# Patient Record
Sex: Female | Born: 2000 | Race: Black or African American | Hispanic: No | Marital: Married | Smoking: Never smoker
Health system: Southern US, Community
[De-identification: ages and names within clinical notes are randomized; demographics above are authoritative.]

## PROBLEM LIST (undated history)

## (undated) ENCOUNTER — Inpatient Hospital Stay: Payer: Self-pay

## (undated) ENCOUNTER — Inpatient Hospital Stay (HOSPITAL_COMMUNITY): Payer: Self-pay

## (undated) DIAGNOSIS — F32A Depression, unspecified: Secondary | ICD-10-CM

## (undated) DIAGNOSIS — F419 Anxiety disorder, unspecified: Secondary | ICD-10-CM

## (undated) DIAGNOSIS — F329 Major depressive disorder, single episode, unspecified: Secondary | ICD-10-CM

## (undated) HISTORY — PX: MULTIPLE TOOTH EXTRACTIONS: SHX2053

## (undated) HISTORY — PX: WISDOM TOOTH EXTRACTION: SHX21

---
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
## 2022-06-25 ENCOUNTER — Inpatient Hospital Stay (HOSPITAL_COMMUNITY)
Admission: AD | Admit: 2022-06-25 | Discharge: 2022-06-25 | Disposition: A | Discharge status: Home / Self Care | Payer: Medicaid Other | Attending: Obstetrics & Gynecology | Admitting: Obstetrics & Gynecology

## 2022-06-25 ENCOUNTER — Encounter (HOSPITAL_COMMUNITY): Payer: Self-pay | Admitting: *Deleted

## 2022-06-25 DIAGNOSIS — R1030 Lower abdominal pain, unspecified: Secondary | ICD-10-CM | POA: Insufficient documentation

## 2022-06-25 DIAGNOSIS — Z3A10 10 weeks gestation of pregnancy: Secondary | ICD-10-CM | POA: Diagnosis not present

## 2022-06-25 DIAGNOSIS — O26891 Other specified pregnancy related conditions, first trimester: Secondary | ICD-10-CM | POA: Diagnosis present

## 2022-06-25 DIAGNOSIS — R0602 Shortness of breath: Secondary | ICD-10-CM | POA: Diagnosis not present

## 2022-06-25 DIAGNOSIS — R079 Chest pain, unspecified: Secondary | ICD-10-CM | POA: Insufficient documentation

## 2022-06-25 DIAGNOSIS — O26899 Other specified pregnancy related conditions, unspecified trimester: Secondary | ICD-10-CM

## 2022-06-25 LAB — URINALYSIS, ROUTINE W REFLEX MICROSCOPIC
Bilirubin Urine: NEGATIVE
Glucose, UA: NEGATIVE mg/dL
Hgb urine dipstick: NEGATIVE
Ketones, ur: NEGATIVE mg/dL
Nitrite: NEGATIVE
Protein, ur: NEGATIVE mg/dL
Specific Gravity, Urine: 1.026 (ref 1.005–1.030)
pH: 5 (ref 5.0–8.0)

## 2022-06-25 NOTE — MAU Note (Signed)
Kara Nolan is a 21 y.o. at Unknown here in MAU reporting: having cramping, feels like her heart is fluttering.  Been feeling light headed. Pt is preg. Has been seen at her regular dr in [place]. Has had [place] that showed viable IUP. No bleeding.  Denies GU or GI complaints Onset of complaint: started yesterday Pain score: mild cramping There were no vitals filed for this visit.   FHT:167 Lab orders placed from triage:  urine

## 2022-06-25 NOTE — MAU Provider Note (Addendum)
History     CSN: [id number]  Arrival date and time: 06/25/22 1557   Event Date/Time   First Provider Initiated Contact with Patient 06/25/22 1650      No chief complaint on file.  HPI Kara Nolan is a 21 y.o. G1P0 at [redacted]w[redacted]d who receives care at [facility]. She has an appt on Jan 11th. She presents today for Cramping and SOB.  She reports the cramping is located on her right side and is intermittent.  She states the pain has no worsening or improving factors and is mild and not currently present. She endorses having an [place] with this pregnancy. She states the pain duration is unknown to her. 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  She states it lasted up until arrival. She states it was difficult for her to catch her breath.  She endorses asthma prior to pregnancy. She reports drinking "enough water" throughout the day.  Patient denies unknown history of asthma.   OB History     Gravida  1   Para      Term      Preterm      AB      Living         SAB      IAB      Ectopic      Multiple      Live Births              Past Medical History:  Diagnosis Date   Depression     Past Surgical History:  Procedure Laterality Date   MULTIPLE TOOTH EXTRACTIONS      Family History  Problem Relation Age of Onset   Hypertension Paternal Grandmother    Heart disease Paternal Grandmother        mitral valve prolapse   Cancer Neg Hx    Stroke Neg Hx    Depression Neg Hx     Social History   Tobacco Use   Smoking status: Never  Substance Use Topics   Alcohol use: No    Comment: Pt denied   Drug use: No    Comment: Pt denied    Allergies: No Known Allergies  Medications Prior to Admission  Medication Sig Dispense Refill Last Dose   cefdinir (OMNICEF) 300 MG capsule Take 2 capsules (600 mg total) by mouth daily. 20 capsule 0    fluticasone (FLONASE) 50 MCG/ACT nasal spray Place 2 sprays into both nostrils  daily. 16 g 0    ibuprofen (ADVIL,MOTRIN) 100 MG/5ML suspension Take 5 mg/kg by mouth every 6 (six) hours as needed.      ibuprofen (ADVIL,MOTRIN) 400 MG tablet Take 1 tablet (400 mg total) by mouth every 6 (six) hours as needed. 30 tablet 0    ondansetron (ZOFRAN) 4 MG tablet Take 1 tablet (4 mg total) by mouth every 8 (eight) hours as needed for nausea or vomiting. 4 tablet 0     Review of Systems  Eyes:  Negative for visual disturbance.  Gastrointestinal:  Positive for abdominal pain (Right side). Negative for constipation, diarrhea, nausea and vomiting.  Genitourinary:  Negative for difficulty urinating, dysuria, vaginal bleeding and vaginal discharge.  Neurological:  Positive for dizziness (None currently, but has occurred prior to arrival.). Negative for light-headedness and headaches.   Physical Exam   Blood pressure (!) 102/55, pulse 79, temperature 99.1 F (37.3 C), temperature source Oral, resp. rate 16, height 5\' 4"  (1.626 m), weight 44.4  kg, SpO2 100 %.  Physical Exam Constitutional:      Appearance: She is well-developed.  HENT:     Head: Normocephalic and atraumatic.  Eyes:     Conjunctiva/sclera: Conjunctivae normal.  Pulmonary:     Effort: Pulmonary effort is normal. No respiratory distress.  Abdominal:     General: Bowel sounds are normal.  Neurological:     Mental Status: She is alert and oriented to person, place, and time.  Psychiatric:        Mood and Affect: Mood normal.        Behavior: Behavior normal.     MAU Course  Procedures Results for orders placed or performed during the hospital encounter of 06/25/22 (from the past 24 hour(s))  Urinalysis, Routine w reflex microscopic Urine, Clean Catch     Status: Abnormal   Collection Time: 06/25/22  5:11 PM  Result Value Ref Range   Color, Urine YELLOW YELLOW   APPearance HAZY (A) CLEAR   Specific Gravity, Urine 1.026 1.005 - 1.030   pH 5.0 5.0 - 8.0   Glucose, UA NEGATIVE NEGATIVE mg/dL   Hgb urine  dipstick NEGATIVE NEGATIVE   Bilirubin Urine NEGATIVE NEGATIVE   Ketones, ur NEGATIVE NEGATIVE mg/dL   Protein, ur NEGATIVE NEGATIVE mg/dL   Nitrite NEGATIVE NEGATIVE   Leukocytes,Ua SMALL (A) NEGATIVE     Patient informed that the ultrasound is considered a limited OB ultrasound and is not intended to be a complete ultrasound exam.  Patient also informed that the ultrasound is not being completed with the intent of assessing for fetal or placental anomalies or any pelvic abnormalities.  Explained that the purpose of today's ultrasound is to assess for  viability.  Patient acknowledges the purpose of the exam and the limitations of the study.  SIUP at 10 wks by CRL. FHR 181.  Yolk Sac noted.         MDM EKG BSUS Assessment and Plan  21 year old G1P0 at 10 weeks Abdominal Cramping SOB  -EKG performed and normal.  -BSUS performed as above. -Discussed collection of vaginal swabs to r/o infection/disease, patient declines stating she has appt tomorrow. -Will send urine for assessment and culture if needed. -Discussed normal physiological changes that occur in pregnancy that can account for feelings of occasional heart fluttering and SOB. Reassured that normal as long as not extended periods or increased frequency. -Further informed that abdominal cramping is likely RLP.  Reviewed this anticipated pregnancy condition/complaint.   -Patient verbalizes understanding and without questions. -Informed that BSUS without concerns. -Precautions reviewed. -Encouraged to call primary office or return to MAU if symptoms worsen or with the onset of new symptoms. -Discharged to home in stable condition.  Maryann Conners 06/25/2022, 4:50 PM   Addendum 6:58 PM) UA without significant findings. No culture ordered.  Maryann Conners MSN, CNM Advanced Practice Provider, [facility]

## 2022-07-17 DIAGNOSIS — R636 Underweight: Secondary | ICD-10-CM | POA: Diagnosis present

## 2022-07-26 ENCOUNTER — Inpatient Hospital Stay (HOSPITAL_COMMUNITY)
Admission: AD | Admit: 2022-07-26 | Discharge: 2022-07-26 | Disposition: A | Discharge status: Home / Self Care | Payer: Medicaid Other | Attending: Family Medicine | Admitting: Family Medicine

## 2022-07-26 ENCOUNTER — Other Ambulatory Visit: Payer: Self-pay

## 2022-07-26 DIAGNOSIS — R1030 Lower abdominal pain, unspecified: Secondary | ICD-10-CM | POA: Diagnosis present

## 2022-07-26 DIAGNOSIS — Z3A14 14 weeks gestation of pregnancy: Secondary | ICD-10-CM | POA: Insufficient documentation

## 2022-07-26 DIAGNOSIS — O26892 Other specified pregnancy related conditions, second trimester: Secondary | ICD-10-CM | POA: Insufficient documentation

## 2022-07-26 DIAGNOSIS — Z5321 Procedure and treatment not carried out due to patient leaving prior to being seen by health care provider: Secondary | ICD-10-CM | POA: Diagnosis not present

## 2022-07-26 LAB — WET PREP, GENITAL
Clue Cells Wet Prep HPF POC: NONE SEEN
Sperm: NONE SEEN
Trich, Wet Prep: NONE SEEN
WBC, Wet Prep HPF POC: >=10 — AB (ref <10)
Yeast Wet Prep HPF POC: NONE SEEN

## 2022-07-26 NOTE — MAU Note (Signed)
Pt left ama, form signed

## 2022-07-26 NOTE — MAU Note (Signed)
.  [name] is a 22 y.o. at [redacted]w[redacted]d here in MAU reporting: lower abd cramping that started yesterday.  Pt reports pain has been on and off since then.  Reports pain is not "as bad as it was last night".  Denies vag bleeding.  Reports she was diagnosed with UTI last week and just started her antibiotics yesterday.    Onset of complaint: 1/20 Pain score: 4 Vitals:   07/26/22 1030  BP: 109/62  Pulse: (!) 106  Resp: 15  Temp: 99 F (37.2 C)  SpO2: 100%     FHT:150 Lab orders placed from triage:   u/a

## 2022-07-27 LAB — GC/CHLAMYDIA PROBE AMP (~~LOC~~) NOT AT ARMC
Chlamydia: NEGATIVE
Comment: NEGATIVE
Comment: NORMAL
Neisseria Gonorrhea: NEGATIVE

## 2022-09-03 ENCOUNTER — Encounter: Payer: Self-pay | Admitting: *Deleted

## 2022-09-07 ENCOUNTER — Encounter (HOSPITAL_COMMUNITY): Payer: Self-pay | Admitting: Family Medicine

## 2022-09-07 ENCOUNTER — Encounter: Payer: Self-pay | Admitting: Obstetrics and Gynecology

## 2022-09-07 ENCOUNTER — Inpatient Hospital Stay (HOSPITAL_COMMUNITY)
Admission: AD | Admit: 2022-09-07 | Discharge: 2022-09-07 | Disposition: A | Discharge status: Home / Self Care | Payer: Medicaid Other | Attending: Family Medicine | Admitting: Family Medicine

## 2022-09-07 DIAGNOSIS — R109 Unspecified abdominal pain: Secondary | ICD-10-CM

## 2022-09-07 DIAGNOSIS — Z3A21 21 weeks gestation of pregnancy: Secondary | ICD-10-CM | POA: Insufficient documentation

## 2022-09-07 DIAGNOSIS — O26892 Other specified pregnancy related conditions, second trimester: Secondary | ICD-10-CM | POA: Diagnosis not present

## 2022-09-07 DIAGNOSIS — Z0371 Encounter for suspected problem with amniotic cavity and membrane ruled out: Secondary | ICD-10-CM

## 2022-09-07 DIAGNOSIS — O26899 Other specified pregnancy related conditions, unspecified trimester: Secondary | ICD-10-CM

## 2022-09-07 DIAGNOSIS — O368120 Decreased fetal movements, second trimester, not applicable or unspecified: Secondary | ICD-10-CM | POA: Diagnosis present

## 2022-09-07 DIAGNOSIS — Z3490 Encounter for supervision of normal pregnancy, unspecified, unspecified trimester: Secondary | ICD-10-CM | POA: Insufficient documentation

## 2022-09-07 DIAGNOSIS — R1030 Lower abdominal pain, unspecified: Secondary | ICD-10-CM | POA: Insufficient documentation

## 2022-09-07 HISTORY — DX: Anxiety disorder, unspecified: F41.9

## 2022-09-07 LAB — URINALYSIS, ROUTINE W REFLEX MICROSCOPIC
Bilirubin Urine: NEGATIVE
Glucose, UA: NEGATIVE mg/dL
Hgb urine dipstick: NEGATIVE
Ketones, ur: NEGATIVE mg/dL
Nitrite: NEGATIVE
Protein, ur: NEGATIVE mg/dL
Specific Gravity, Urine: 1.018 (ref 1.005–1.030)
Squamous Epithelial / HPF: >50 /HPF (ref 0–5)
pH: 6 (ref 5.0–8.0)

## 2022-09-07 LAB — WET PREP, GENITAL
Clue Cells Wet Prep HPF POC: NONE SEEN
Sperm: NONE SEEN
Trich, Wet Prep: NONE SEEN
WBC, Wet Prep HPF POC: <10 (ref <10)
Yeast Wet Prep HPF POC: NONE SEEN

## 2022-09-07 LAB — AMNISURE RUPTURE OF MEMBRANE (ROM) NOT AT ARMC: Amnisure ROM: NEGATIVE

## 2022-09-07 NOTE — MAU Note (Addendum)
.  [name] is a 22 y.o. at 43w1dhere in MAU reporting: has not really felt baby move for a few days. . Stated she has felt like she has had some leaking (wet underwear) for a few days as well and c/o some cramping since Friday  LMP:  Onset of complaint: Friday Pain score: 7 Vitals:   09/07/22 1613  BP: 109/60  Pulse: 75  Resp: 18  Temp: 98.8 F (37.1 C)     FHT:146 Lab orders placed from triage:  u/a

## 2022-09-07 NOTE — Discharge Instructions (Signed)

## 2022-09-07 NOTE — MAU Provider Note (Signed)
History     CSN: [id number]  Arrival date and time: 09/07/22 1523   Event Date/Time   First Provider Initiated Contact with Patient 09/07/22 1629      Chief Complaint  Patient presents with   Decreased Fetal Movement   Vaginal Discharge   HPI  Kara Nolan is a 22 y.o. G1P0 at 57w1dwho presents for evaluation of decreased fetal movement. Patient reports she has felt less movement over the last 2 days. She also reports feeling like her underwear are wet. She reports it is clear discharge. She reports lower abdominal cramping since the leaking started. Patient rates the pain as a 3/10 and has not tried anything for the pain. She denies any vaginal bleeding. Denies any constipation, diarrhea or any urinary complaints.   OB History     Gravida  1   Para      Term      Preterm      AB      Living         SAB      IAB      Ectopic      Multiple      Live Births              Past Medical History:  Diagnosis Date   Anxiety    Depression     Past Surgical History:  Procedure Laterality Date   MULTIPLE TOOTH EXTRACTIONS      Family History  Problem Relation Age of Onset   Hypertension Paternal Grandmother    Heart disease Paternal Grandmother        mitral valve prolapse   Cancer Neg Hx    Stroke Neg Hx    Depression Neg Hx     Social History   Tobacco Use   Smoking status: Never  Substance Use Topics   Alcohol use: No    Comment: Pt denied   Drug use: No    Comment: Pt denied    Allergies: No Known Allergies  Medications Prior to Admission  Medication Sig Dispense Refill Last Dose   Prenatal Vit-Fe Fumarate-FA (PRENATAL MULTIVITAMIN) TABS tablet Take 1 tablet by mouth daily at 12 noon.   Past Week   ondansetron (ZOFRAN) 4 MG tablet Take 1 tablet (4 mg total) by mouth every 8 (eight) hours as needed for nausea or vomiting. 4 tablet 0     Review of Systems  Constitutional: Negative.  Negative for fatigue and fever.  HENT:  Negative.    Respiratory: Negative.  Negative for shortness of breath.   Cardiovascular: Negative.  Negative for chest pain.  Gastrointestinal:  Positive for abdominal pain. Negative for constipation, diarrhea, nausea and vomiting.  Genitourinary:  Positive for vaginal discharge. Negative for dysuria and vaginal bleeding.  Neurological: Negative.  Negative for dizziness and headaches.   Physical Exam   Blood pressure 109/60, pulse 75, temperature 98.8 F (37.1 C), resp. rate 18, height '5\' 4"'$  (1.626 m), weight 46.7 kg, last menstrual period 04/12/2022.  Patient Vitals for the past 24 hrs:  BP Temp Pulse Resp Height Weight  09/07/22 1613 109/60 98.8 F (37.1 C) 75 18 '5\' 4"'$  (1.626 m) 46.7 kg    Physical Exam Vitals and nursing note reviewed.  Constitutional:      General: She is not in acute distress.    Appearance: She is well-developed.  HENT:     Head: Normocephalic.  Eyes:     Pupils: Pupils are equal, round, and reactive  to light.  Cardiovascular:     Rate and Rhythm: Normal rate and regular rhythm.     Heart sounds: Normal heart sounds.  Pulmonary:     Effort: Pulmonary effort is normal. No respiratory distress.     Breath sounds: Normal breath sounds.  Abdominal:     General: Bowel sounds are normal. There is no distension.     Palpations: Abdomen is soft.     Tenderness: There is no abdominal tenderness.  Skin:    General: Skin is warm and dry.  Neurological:     Mental Status: She is alert and oriented to person, place, and time.  Psychiatric:        Mood and Affect: Mood normal.        Behavior: Behavior normal.        Thought Content: Thought content normal.        Judgment: Judgment normal.     FHT: 146 bpm  MAU Course  Procedures  Results for orders placed or performed during the hospital encounter of 09/07/22 (from the past 24 hour(s))  Urinalysis, Routine w reflex microscopic -Urine, Clean Catch     Status: Abnormal   Collection Time: 09/07/22  4:23  PM  Result Value Ref Range   Color, Urine YELLOW YELLOW   APPearance CLOUDY (A) CLEAR   Specific Gravity, Urine 1.018 1.005 - 1.030   pH 6.0 5.0 - 8.0   Glucose, UA NEGATIVE NEGATIVE mg/dL   Hgb urine dipstick NEGATIVE NEGATIVE   Bilirubin Urine NEGATIVE NEGATIVE   Ketones, ur NEGATIVE NEGATIVE mg/dL   Protein, ur NEGATIVE NEGATIVE mg/dL   Nitrite NEGATIVE NEGATIVE   Leukocytes,Ua MODERATE (A) NEGATIVE   RBC / HPF 0-5 0 - 5 RBC/hpf   WBC, UA 6-10 0 - 5 WBC/hpf   Bacteria, UA RARE (A) NONE SEEN   Squamous Epithelial / HPF >50 0 - 5 /HPF   Mucus PRESENT   Wet prep, genital     Status: None   Collection Time: 09/07/22  4:47 PM   Specimen: Vaginal  Result Value Ref Range   Yeast Wet Prep HPF POC NONE SEEN NONE SEEN   Trich, Wet Prep NONE SEEN NONE SEEN   Clue Cells Wet Prep HPF POC NONE SEEN NONE SEEN   WBC, Wet Prep HPF POC <10 <10   Sperm NONE SEEN   Amnisure rupture of membrane (rom)not at [facility]     Status: None   Collection Time: 09/07/22  4:47 PM  Result Value Ref Range   Amnisure ROM NEGATIVE      MDM Labs ordered and reviewed.   UA  CNM discussed standard evaluation of possible ROM is sterile speculum exam. Patient declines. Discussed amnisure and patient agreeable.   Amnisure Wet prep and gc/chlamydia  CNM recommended cervical exam due to lower abdominal pain in pregnancy. Patient verbally agreed but when attempted to do exam, patient unable to tolerate CNM touching introitus and refused any attempt at cervical exam. CNM discussed that digital or speculum exam were only ways to rule out preterm labor. Patient verbalized understanding and declines.  Discussed expectation for fetal movement at this gestation.   Assessment and Plan   1. Encounter for suspected premature rupture of amniotic membranes, with rupture of membranes not found   2. Abdominal pain affecting pregnancy   3. [redacted] weeks gestation of pregnancy     -Discharge home in stable condition -Second  trimester precautions discussed -Patient advised to follow-up with OB as scheduled for  prenatal care -Patient may return to MAU as needed or if her condition were to change or worsen  Wende Mott, CNM 09/07/2022, 4:29 PM

## 2022-09-08 ENCOUNTER — Encounter: Payer: Medicaid Other | Admitting: Obstetrics and Gynecology

## 2022-09-08 DIAGNOSIS — Z3490 Encounter for supervision of normal pregnancy, unspecified, unspecified trimester: Secondary | ICD-10-CM

## 2022-09-08 LAB — GC/CHLAMYDIA PROBE AMP (~~LOC~~) NOT AT ARMC
Chlamydia: NEGATIVE
Comment: NEGATIVE
Comment: NORMAL
Neisseria Gonorrhea: NEGATIVE

## 2022-09-10 NOTE — Progress Notes (Signed)
Patient cancelled her appointment. 

## 2022-09-24 ENCOUNTER — Encounter (HOSPITAL_COMMUNITY): Payer: Self-pay | Admitting: Obstetrics & Gynecology

## 2022-09-24 ENCOUNTER — Inpatient Hospital Stay (HOSPITAL_COMMUNITY)
Admission: AD | Admit: 2022-09-24 | Discharge: 2022-09-24 | Disposition: A | Discharge status: Home / Self Care | Payer: Medicaid Other | Attending: Obstetrics & Gynecology | Admitting: Obstetrics & Gynecology

## 2022-09-24 DIAGNOSIS — Z3A23 23 weeks gestation of pregnancy: Secondary | ICD-10-CM | POA: Insufficient documentation

## 2022-09-24 DIAGNOSIS — O26892 Other specified pregnancy related conditions, second trimester: Secondary | ICD-10-CM | POA: Insufficient documentation

## 2022-09-24 DIAGNOSIS — R1030 Lower abdominal pain, unspecified: Secondary | ICD-10-CM | POA: Insufficient documentation

## 2022-09-24 DIAGNOSIS — N898 Other specified noninflammatory disorders of vagina: Secondary | ICD-10-CM

## 2022-09-24 DIAGNOSIS — R109 Unspecified abdominal pain: Secondary | ICD-10-CM | POA: Diagnosis not present

## 2022-09-24 LAB — URINALYSIS, ROUTINE W REFLEX MICROSCOPIC
Bilirubin Urine: NEGATIVE
Glucose, UA: NEGATIVE mg/dL
Hgb urine dipstick: NEGATIVE
Ketones, ur: NEGATIVE mg/dL
Leukocytes,Ua: NEGATIVE
Nitrite: NEGATIVE
Protein, ur: NEGATIVE mg/dL
Specific Gravity, Urine: 1.027 (ref 1.005–1.030)
pH: 5 (ref 5.0–8.0)

## 2022-09-24 NOTE — MAU Note (Signed)
.  Kara Nolan is a 22 y.o. at [redacted]w[redacted]d here in MAU reporting: she got home from work an felt like she needed to have a BM. She was able to go and after she wiped she has some yellow mucus come out. Now c/o lower abd cramping.  LMP:  Onset of complaint: 1hr Pain score: 8 Vitals:   09/24/22 1923  BP: 116/68  Pulse: 91  Temp: 98 F (36.7 C)     FHT:145 Lab orders placed from triage:   U/a

## 2022-09-24 NOTE — Discharge Instructions (Signed)
Reasons to return to MAU at [facility]:  Since you are preterm, return to MAU if:  1.  Contractions are 10 minutes apart or less and they becoming more uncomfortable or painful over time 2.  You have a large gush of fluid, or a trickle of fluid that will not stop and you have to wear a pad 3.  You have bleeding that is bright red, heavier than spotting--like menstrual bleeding (spotting can be normal in early labor or after a check of your cervix) 4.  You do not feel the baby moving like he/she normally does  

## 2022-09-24 NOTE — MAU Provider Note (Addendum)
History    Chief Complaint  Patient presents with   Abdominal Pain   Vaginal Discharge   Kara Nolan is a 22yo G1p0 at [redacted]w[redacted]d presenting to the MAU with loss of vaginal mucous and lower abdominal cramping. She reports that, at around 5:45pm today, she had a BM and then had some thick, yellow mucous when she wiped. She denies vaginal bleeding, loss of fluid. She then started to experience cramping sensation in her lower abdomen radiating to her back, which she described as similar to period cramps. These occur around every half hour; she has had four episodes since 5:45. She reports poor hydration today given she's been kept busy at her job. She reports good FM. No CP, SOB, HA, visual changes. No burning, pain with urination. No burning, pain in vaginal area. Some nausea through pregnancy, no vomiting.  Abdominal Pain Pertinent negatives include no dysuria or vomiting.  Vaginal Discharge The patient's primary symptoms include vaginal discharge. Associated symptoms include abdominal pain. Pertinent negatives include no dysuria, flank pain or vomiting.    OB History     Gravida  1   Para      Term      Preterm      AB      Living         SAB      IAB      Ectopic      Multiple      Live Births              Past Medical History:  Diagnosis Date   Anxiety    Depression     Past Surgical History:  Procedure Laterality Date   MULTIPLE TOOTH EXTRACTIONS     WISDOM TOOTH EXTRACTION      Family History  Problem Relation Age of Onset   Hypertension Paternal Grandmother    Heart disease Paternal Grandmother        mitral valve prolapse   Cancer Neg Hx    Stroke Neg Hx    Depression Neg Hx     Social History   Tobacco Use   Smoking status: Never  Vaping Use   Vaping Use: Never used  Substance Use Topics   Alcohol use: No    Comment: Pt denied   Drug use: No    Comment: Pt denied    Allergies: No Known Allergies  Medications Prior to  Admission  Medication Sig Dispense Refill Last Dose   Prenatal Vit-Fe Fumarate-FA (PRENATAL MULTIVITAMIN) TABS tablet Take 1 tablet by mouth daily at 12 noon.   09/24/2022   ondansetron (ZOFRAN) 4 MG tablet Take 1 tablet (4 mg total) by mouth every 8 (eight) hours as needed for nausea or vomiting. 4 tablet 0     Review of Systems  Eyes:  Negative for visual disturbance.  Respiratory:  Negative for shortness of breath.   Cardiovascular:  Negative for chest pain.  Gastrointestinal:  Positive for abdominal pain. Negative for vomiting.  Genitourinary:  Positive for vaginal discharge. Negative for dysuria, flank pain and vaginal pain.   Physical Exam Blood pressure 116/68, pulse 91, temperature 98 F (36.7 C), last menstrual period 04/12/2022. Physical Exam Constitutional:      General: She is not in acute distress. Abdominal:     Palpations: Abdomen is soft.     Tenderness: There is no abdominal tenderness.  Neurological:     Mental Status: She is alert.   Fetal tracing appropriate for gestational age.  Dilation:  Closed Effacement (%): Thick Cervical Position: Posterior Exam by:: L.Leftwich-kIRBY,cnm  MAU Course Procedures  MDM Abdominal pain Given infrequency of contractions (with lack of contraction on monitor) and closed, thick, long cervix on exam, no concern at this time for preterm labor. Pt reassured Of note, pt had difficulty tolerating cervical exam; was able to complete with relaxation techniques, though patient was tearful and shaky after Vaginal discharge Patient reassured mucous not indicative of impending labor Urinalysis done G1P0 at [redacted]w[redacted]d  Amanda Nemecek, Medical Student 09/24/2022, 8:46 PM  CNM attestation:  I have seen and examined this patient and agree with above documentation in the medical student's note.   [name] is a 22 y.o. G1P0 at [redacted]w[redacted]d reporting seeing mucus when wiping and onset of lower abdominal cramping today. +FM, denies LOF,  VB  PE: Patient Vitals for the past 24 hrs:  BP Temp Pulse  09/24/22 1923 116/68 98 F (36.7 C) 91   Gen: calm comfortable, NAD Resp: normal effort, no distress Heart: Regular rate Abd: Soft, NT, gravid, S=D  FHR: Baseline 145, moderate Variability, pos (10 x 10) accels, no decels Toco: No contractions on toco or to palpation  ROS, labs, PMH reviewed  Orders Placed This Encounter  Procedures   Discharge patient   No orders of the defined types were placed in this encounter.   MDM No evidence of PTL with closed cervix.  Assessment: 1. Abdominal pain during pregnancy in second trimester   2. [redacted] weeks gestation of pregnancy   3. Vaginal discharge during pregnancy in second trimester     Plan: - Discharge home in stable condition - PTL precautions. - Follow-up as scheduled at your doctor's office for next prenatal visit or sooner as needed if symptoms worsen. - Return to maternity admissions symptoms worsen  Simona Huh 09/24/2022 8:59 PM

## 2022-09-26 ENCOUNTER — Encounter: Payer: Medicaid Other | Admitting: Nurse Practitioner

## 2022-09-26 DIAGNOSIS — O219 Vomiting of pregnancy, unspecified: Secondary | ICD-10-CM

## 2022-09-26 NOTE — Progress Notes (Signed)
I have spent 5 minutes in review of e-visit questionnaire, review and updating patient chart, medical decision making and response to patient.  ° °Averleigh Savary W Parag Dorton, NP ° °  °

## 2022-09-26 NOTE — Progress Notes (Signed)
I recommend over the counter B6. We do not typically treat pregnant patients over 20 weeks. You will need to send your OB GYN a message, call the after hours or on call or if your N/V does not get better be seen at urgent care.    NOTE: You will NOT be charged for this eVisit.    If you are having a true medical emergency please call 911.   Your e-visit answers were reviewed by a board certified advanced clinical practitioner to complete your personal care plan.  Thank you for using e-Visits.

## 2023-07-07 NOTE — L&D Delivery Note (Signed)
 Delivery Note  Kara Nolan is a G2P1001 at [redacted]w[redacted]d, No LMP recorded (lmp unknown). Patient is pregnant., not consistent with US  at [redacted]w[redacted]d. Estimated Date of Delivery: 03/01/24   First Stage: Labor onset: 2300 Augmentation: None Analgesia /Anesthesia intrapartum: None AROM at 0833 GBS: negative  Second Stage: Complete dilation at 0820 Onset of pushing at 0820 (pushing on arrival to L&D) FHR second stage 145 bpm with moderate variability, variable decels with pushing   Kara Nolan presented to L&D with active labor and immanent delivery. She arrived via EMS yelling I'm pushing. She was quickly moved to a labor bed and found to be C/C/+2. AROM performed immediately before delivery. She pushed for a spontaneous vaginal birth.  Delivery of a viable baby girl on 02/15/2024 at 0835 by CNM Delivery of fetal head in OA position with restitution to ROT. No nuchal cord;  Anterior then posterior shoulders delivered easily with gentle downward traction. Right compound hand delivered easily with shoulder. Baby placed on mom's chest, and attended to by baby RN Cord double clamped after cessation of pulsation, cut by father of baby.  Cord blood sample collection: Yes Conflict (See Lab Report): B POS/B POS Performed at [facility], [address] [place], [place] [address] -Cord blood colleted due to unknown blood type, but not sent to lab after B Positive confirmed   Third Stage: Oxytocin  bolus started after delivery of infant for hemorrhage prophylaxis  Placenta delivered via Keren mechanism intact with 3 VC @ 0843 Placenta disposition: discarded  Uterine tone firm / bleeding scant   Laceration: small vaginal abrasion  Anesthesia for repair: N/A Suture: N/A Est. Blood Loss (mL): 50 ml   Complications: None  Mom to postpartum.  Baby to Couplet care / Skin to Skin.  Newborn: Information for the patient's newborn:  [name], [name] [[id number]]  Live born female  Birth  Weight: 6 lb 5.9 oz (2890 g) APGAR: 9, 9  Newborn Delivery   Birth date/time: 02/15/2024 08:35:00 Delivery type: Vaginal, Spontaneous      Feeding planned: breast feeding  ---------- Therisa Pillow, CNM Certified Nurse Midwife [place]  [facility]

## 2023-07-27 LAB — OB RESULTS CONSOLE RUBELLA ANTIBODY, IGM: Rubella: IMMUNE

## 2023-07-27 LAB — OB RESULTS CONSOLE HEPATITIS B SURFACE ANTIGEN: Hepatitis B Surface Ag: NEGATIVE

## 2023-08-22 ENCOUNTER — Inpatient Hospital Stay (HOSPITAL_COMMUNITY)
Admission: AD | Admit: 2023-08-22 | Discharge: 2023-08-22 | Discharge status: Against Medical Advice | Payer: Medicaid Other | Attending: Obstetrics & Gynecology | Admitting: Obstetrics & Gynecology

## 2023-08-22 ENCOUNTER — Encounter (HOSPITAL_COMMUNITY): Payer: Self-pay | Admitting: Obstetrics & Gynecology

## 2023-08-22 DIAGNOSIS — Z5321 Procedure and treatment not carried out due to patient leaving prior to being seen by health care provider: Secondary | ICD-10-CM | POA: Diagnosis not present

## 2023-08-22 DIAGNOSIS — Z5329 Procedure and treatment not carried out because of patient's decision for other reasons: Secondary | ICD-10-CM | POA: Diagnosis present

## 2023-08-22 LAB — URINALYSIS, ROUTINE W REFLEX MICROSCOPIC
Bilirubin Urine: NEGATIVE
Glucose, UA: NEGATIVE mg/dL
Hgb urine dipstick: NEGATIVE
Ketones, ur: NEGATIVE mg/dL
Leukocytes,Ua: NEGATIVE
Nitrite: NEGATIVE
Protein, ur: NEGATIVE mg/dL
Specific Gravity, Urine: 1.021 (ref 1.005–1.030)
pH: 7 (ref 5.0–8.0)

## 2023-08-22 LAB — POCT PREGNANCY, URINE: Preg Test, Ur: POSITIVE — AB

## 2023-08-22 MED ORDER — ONDANSETRON 4 MG PO TBDP
8.0000 mg | ORAL_TABLET | Freq: Once | ORAL | Status: DC
  Filled 2023-08-22: qty 2

## 2023-08-22 NOTE — MAU Provider Note (Signed)
@   2000 called patient from the waiting room and I was advised by RN's that V. Katrinka Blazing CNM had originally spoke with patient who refused all medications offered. Spoke with Ivonne Andrew CNM at 36 and she advised me that she did not physically see the patient and therefore she believes the patient left AMA. No notes were or MSE was filed for this patient. Marcell Barlow, MSN, [facility], Center for [employer]

## 2023-08-22 NOTE — MAU Note (Signed)
 Offered pt Zofran for her nausea told her she was not dehydrated did not need IV fluids. Pt did not want to take zofran said it made her vomit.  Informed her that  I would tell the provider and she would come talk to her in the family room.

## 2023-08-22 NOTE — MAU Note (Signed)
.  Kara Nolan is a 23 y.o. at [redacted]w[redacted]d here in MAU reporting: emesis x 2 today, ongoing nausea.  States she has only taken diclegis, today, but not her other 3 nausea meds.  Pt also reports lower abd cramping. Denies bleeding.  States she gets care at [facility].    Pain score: 6 Vitals:   08/22/23 1816  BP: (!) 106/49  Pulse: 94  Resp: 15  Temp: 98.5 F (36.9 C)     FHT: 165 Lab orders placed from triage: ua

## 2023-09-29 ENCOUNTER — Inpatient Hospital Stay (HOSPITAL_COMMUNITY)
Admission: AD | Admit: 2023-09-29 | Discharge: 2023-09-29 | Disposition: A | Discharge status: Home / Self Care | Attending: Obstetrics and Gynecology | Admitting: Obstetrics and Gynecology

## 2023-09-29 DIAGNOSIS — Z3A18 18 weeks gestation of pregnancy: Secondary | ICD-10-CM | POA: Diagnosis not present

## 2023-09-29 DIAGNOSIS — O9A212 Injury, poisoning and certain other consequences of external causes complicating pregnancy, second trimester: Secondary | ICD-10-CM | POA: Diagnosis not present

## 2023-09-29 DIAGNOSIS — R252 Cramp and spasm: Secondary | ICD-10-CM | POA: Insufficient documentation

## 2023-09-29 DIAGNOSIS — W010XXA Fall on same level from slipping, tripping and stumbling without subsequent striking against object, initial encounter: Secondary | ICD-10-CM | POA: Insufficient documentation

## 2023-09-29 DIAGNOSIS — M79675 Pain in left toe(s): Secondary | ICD-10-CM | POA: Insufficient documentation

## 2023-09-29 DIAGNOSIS — W19XXXA Unspecified fall, initial encounter: Secondary | ICD-10-CM | POA: Diagnosis not present

## 2023-09-29 DIAGNOSIS — R109 Unspecified abdominal pain: Secondary | ICD-10-CM | POA: Diagnosis present

## 2023-09-29 DIAGNOSIS — O26892 Other specified pregnancy related conditions, second trimester: Secondary | ICD-10-CM

## 2023-09-29 NOTE — MAU Provider Note (Signed)
  S Ms. Kara Nolan is a 23 y.o. G2P0 patient who presents to MAU today with complaint of fall at ~ 1730 today. She is complaining of some pain in her abdomen and her big toe on her left foot. Denies any VB, LOF, and feels FM, FHR was documented at 157  Review of Systems  Musculoskeletal:  Positive for falls.  All other systems reviewed and are negative.    O BP 99/61   Pulse 97   Temp 98.7 F (37.1 C)   Resp 18   Ht 5\' 5"  (1.651 m)   Wt 47.2 kg   BMI 17.31 kg/m  Physical Exam Vitals and nursing note reviewed.  Constitutional:      General: She is not in acute distress.    Appearance: Normal appearance. She is not ill-appearing.  HENT:     Head: Normocephalic.  Cardiovascular:     Rate and Rhythm: Normal rate.  Pulmonary:     Effort: Pulmonary effort is normal.     Breath sounds: Normal breath sounds.  Abdominal:     Palpations: Abdomen is soft.     Comments: No visual trauma to the abdomen   Musculoskeletal:        General: No signs of injury. Normal range of motion.     Cervical back: Normal range of motion.  Skin:    General: Skin is warm.  Neurological:     Mental Status: She is alert and oriented to person, place, and time.  Psychiatric:        Mood and Affect: Mood normal.        Behavior: Behavior normal.    MDM  Moderate  - BSUS performed for maternal reassurance  - Blood type is B Positive   Pt informed that the ultrasound is considered a limited OB ultrasound and is not intended to be a complete ultrasound exam.  Patient also informed that the ultrasound is not being completed with the intent of assessing for fetal or placental anomalies or any pelvic abnormalities.  Explained that the purpose of today's ultrasound is to assess for  viability.  Patient acknowledges the purpose of the exam and the limitations of the study.   Good FM's observed with FHR visualized and heard on doppler    I have reviewed the patient chart and performed the  physical exam . I have ordered & interpreted the lab results and reviewed and interpreted the Ultrasound images performed by me at the bedside A/P as described below.  Counseling and education provided and patient agreeable  with plan as described below. Verbalized understanding.    ASSESSMENT Medical screening exam complete   1. Fall, initial encounter (Primary)  2. [redacted] weeks gestation of pregnancy    PLAN Discharge from MAU in stable condition Patient given the option of transfer to [facility] for further evaluation or seek care in outpatient facility of choice  Has follow up with [facility] Provider at [facility]  List of options for follow-up given  Warning signs for worsening condition that would warrant emergency follow-up discussed Patient may return to MAU as needed   Colman Cater, NP 09/29/2023 6:32 PM

## 2023-09-29 NOTE — MAU Note (Signed)
.  Kara Nolan is a 23 y.o. at [redacted]w[redacted]d here in MAU reporting: she was running toward her daughter ( who she thought was choking)tripped and fell on her stomach. Tried to break fall with her hands. Stomach is hurting and cramping. Denies any vag bleeding or discharge.Reports she bent back her big toe on her left foot and that is hurting as well.   LMP:  Onset of complaint: 1hr ago Pain score: 6-8 Vitals:   09/29/23 1827  BP: 99/61  Pulse: 97  Resp: 18  Temp: 98.7 F (37.1 C)     FHT: 157  Lab orders placed from triage:

## 2023-10-18 DIAGNOSIS — Z3480 Encounter for supervision of other normal pregnancy, unspecified trimester: Secondary | ICD-10-CM | POA: Insufficient documentation

## 2023-11-19 LAB — OB RESULTS CONSOLE RUBELLA ANTIBODY, IGM
Rubella: IMMUNE
Rubella: IMMUNE

## 2023-11-19 LAB — OB RESULTS CONSOLE RPR: RPR: NONREACTIVE

## 2023-12-30 ENCOUNTER — Other Ambulatory Visit: Payer: Self-pay

## 2023-12-30 ENCOUNTER — Observation Stay
Admission: EM | Admit: 2023-12-30 | Discharge: 2023-12-30 | Disposition: A | Discharge status: Home / Self Care | Attending: Certified Nurse Midwife | Admitting: Certified Nurse Midwife

## 2023-12-30 ENCOUNTER — Encounter: Payer: Self-pay | Admitting: Obstetrics and Gynecology

## 2023-12-30 DIAGNOSIS — O99013 Anemia complicating pregnancy, third trimester: Secondary | ICD-10-CM | POA: Diagnosis not present

## 2023-12-30 DIAGNOSIS — O219 Vomiting of pregnancy, unspecified: Secondary | ICD-10-CM | POA: Diagnosis not present

## 2023-12-30 DIAGNOSIS — O26893 Other specified pregnancy related conditions, third trimester: Principal | ICD-10-CM | POA: Insufficient documentation

## 2023-12-30 DIAGNOSIS — Z3A31 31 weeks gestation of pregnancy: Secondary | ICD-10-CM | POA: Diagnosis not present

## 2023-12-30 DIAGNOSIS — D649 Anemia, unspecified: Secondary | ICD-10-CM | POA: Insufficient documentation

## 2023-12-30 DIAGNOSIS — R109 Unspecified abdominal pain: Secondary | ICD-10-CM | POA: Insufficient documentation

## 2023-12-30 DIAGNOSIS — O26899 Other specified pregnancy related conditions, unspecified trimester: Principal | ICD-10-CM | POA: Clinically undetermined

## 2023-12-30 LAB — URINALYSIS, ROUTINE W REFLEX MICROSCOPIC
Bilirubin Urine: NEGATIVE
Glucose, UA: NEGATIVE mg/dL
Hgb urine dipstick: NEGATIVE
Ketones, ur: NEGATIVE mg/dL
Nitrite: NEGATIVE
Protein, ur: NEGATIVE mg/dL
Specific Gravity, Urine: 1.015 (ref 1.005–1.030)
pH: 7 (ref 5.0–8.0)

## 2023-12-30 LAB — WET PREP, GENITAL
Clue Cells Wet Prep HPF POC: NONE SEEN
Sperm: NONE SEEN
Trich, Wet Prep: NONE SEEN
WBC, Wet Prep HPF POC: <10 (ref <10)
Yeast Wet Prep HPF POC: NONE SEEN

## 2023-12-30 LAB — CHLAMYDIA/NGC RT PCR (ARMC ONLY)
Chlamydia Tr: NOT DETECTED
N gonorrhoeae: NOT DETECTED

## 2023-12-30 MED ORDER — ACETAMINOPHEN 500 MG PO TABS: 1000.0000 mg | ORAL_TABLET | Freq: Once | ORAL | Status: DC

## 2023-12-30 MED ORDER — LACTATED RINGERS IV BOLUS: 1000.0000 mL | Freq: Once | INTRAVENOUS | Status: DC

## 2023-12-30 MED ORDER — ONDANSETRON 4 MG PO TBDP: 4.0000 mg | ORAL_TABLET | Freq: Once | ORAL | Status: DC

## 2023-12-30 NOTE — OB Triage Note (Signed)
 Patient stats she started hurting about a week ago, vomited once on Saturday and nonce since. Patient stats she has had come cramping as well as her lower abdomen is sore and achy. Being up and on her feet make it worse. Bath did help. This morning she wiped after going to the bathroom and had a mucous plug. Denies LOF, Vaginal bleeding or any other concerns. Monitors applied and assessing. Baby moving well

## 2023-12-30 NOTE — Progress Notes (Signed)
 CNM at bedside discussing options and POC, Patient refusing IV, labs, medications, or cervical exam. Pt has agreed to do UA and Vaginal swab but does not want to stay for results, pt would like to leave. CNM talked about calling [facility] and transferring care (pt moved to [place] last week).

## 2023-12-30 NOTE — Progress Notes (Signed)
 Patient discharged home, discharge instructions given, patient states understanding. Patient left floor in stable condition, denies any other needs at this time. Patient to call [facility] to schedule appointment/transfer.

## 2023-12-30 NOTE — Discharge Summary (Addendum)
 Kara Nolan is a 23 y.o. female. She is at [redacted]w[redacted]d gestation. No LMP recorded. Patient is pregnant. Estimated Date of Delivery: 03/01/24  Prenatal care site: [facility]   Chief complaint: Abdominal cramping, pelvic pressure in pregnancy, and nausea/vomiting in pregnancy  HPI: Kara Nolan presents to L&D with  concerns of felling blah, per patient. She reports cramping and pelvic pressure about a week ago. She states she vomited x1 on Saturday (06/21). She has not had any nausea/vomiting since. She reports experiencing occasional cramping. She states she felt sore and achy in her lower abdomen recently. She states being up on her feet makes it worse. She states taking a bath helped with symptoms. She also reports this morning (06/26) she lost her mucous plug. Denies vaginal bleeding, leakage of fluid, and labor contractions. Reports active fetal movement.   She reports she receives her prenatal care in [place], [place], but she recently moved to [place], [place] and desires to receive her prenatal care closer to where she currently resides. 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   Factors complicating pregnancy: Anemia Short Interval pregnancy  Fetal pyelectasis, antepartum, single or unspecified fetus   S: Resting comfortably. Reports no contractions, no vaginal bleeding, and no leakage of fluid. Endorses active fetal movement.   Maternal Medical History:  Past Medical Hx:  has a past medical history of Anxiety and Depression.    Past Surgical Hx:  has a past surgical history that includes Multiple tooth extractions and Wisdom tooth extraction.   No Known Allergies   Prior to Admission medications   Medication Sig Start Date End Date Taking? Authorizing Provider  ondansetron  (ZOFRAN ) 4 MG tablet Take 1 tablet (4 mg total) by mouth  every 8 (eight) hours as needed for nausea or vomiting. Patient not taking: Reported on 12/30/2023 07/06/17   Maczis, Michael M, PA-C  Prenatal Vit-Fe Fumarate-FA (PRENATAL MULTIVITAMIN) TABS tablet Take 1 tablet by mouth daily at 12 noon. Patient not taking: Reported on 12/30/2023    [provider]    Social History: She  reports that she has never smoked. She does not have any smokeless tobacco history on file. She reports that she does not drink alcohol and does not use drugs.  Family History: family history includes Heart disease in her paternal grandmother; Hypertension in her paternal grandmother.  Review of Systems:  Review of Systems  Constitutional: Negative.   Gastrointestinal:  Positive for abdominal pain (occasional abdominal cramping).  Genitourinary: Negative.   Neurological: Negative.   Psychiatric/Behavioral: Negative.        O:  BP (!) 108/55 (BP Location: Right Arm)   Pulse 96   Temp 98.1 F (36.7 C) (Oral)   Resp 18  No results found for this or any previous visit (from the past 48 hours).   Constitutional: NAD, AAOx3  HE/ENT: extraocular movements grossly intact CV: RRR PULM: nl respiratory effort Abd: gravid, non-tender, non-distended, soft  Ext: Non-tender, Nonedmeatous Psych: mood appropriate, speech normal Pelvic : Deferred  SVE: *Patient declined*   NST (12/30/2023) Baseline: 135 bpm Variability: moderate Accels: Present Decels: none Toco: none Category: I Interpretation:  INDICATIONS: rule out uterine contractions RESULTS:  A NST procedure was performed with FHR monitoring and a normal baseline established, appropriate time of 20-40 minutes of evaluation, and accels >2 seen with at least 10x10 characteristics.  Results show a REACTIVE NST.   Assessment: 23 y.o. [redacted]w[redacted]d here for antenatal surveillance during pregnancy.  Principle diagnosis: Abdominal cramping affecting pregnancy, antepartum, nausea and vomiting of pregnancy, and pelvic  pressure in pregnancy, antepartum.   Plan: Abdominal Cramping affecting pregnancy, antepartum Labor: Not present. Abdomen soft/nontender to palpation. No reports of contractions. NST completed. FHR observed for approximately 20 mins. No contractions noted on FHR tracing.  Fetal Wellbeing: Reassuring Cat 1 tracing. Reactive NST *Patient declined cervical exam* *Patient declined labs (CBC and CMP)* Urinalysis collected (12/30/2023). Wet prep and GC/C collected (12/30/2023) via self swab. Patient requested to self swab. Self swab observed by RN. Results as follows:   - Wet Prep: Negative    - Urinalysis: WNL   - GC/C: Pending *Patient declined Tylenol  1,000 mg PO* FFN (12/16/2023): Negative Labor precautions briefly reviewed with patient and signs and symptoms of labor provided in AVS. Patient verbalized understanding.   2. Nausea and vomiting of pregnancy, antepartum  Patient declined Zofran  4 mg  *Patient declined 1,000 mL LR bolus.* *Patient declined labs (CBC and CMP)*  3. Pelvic pressure in pregnancy, antepartum  Education of ways to manage Braxton Hicks contractions reviewed with patient. Patient informed that rest, increase fluid intake, changing positions, and empty bladder frequently may provide relief of symptoms. Patient verbalized understanding.   4. Anemia Patient reports that she is anemic and declined iron infusion due to fear of needles and states, she does not take iron supplements or prenatal vitamins because it causes her to be sick. Patient encouraged to take prenatal vitamins with meal to ease nausea/sickness.  Patient informed that an alternative to iron supplements and iron infusions is Floradix liquid iron and encouraged to try this in efforts to increase iron levels. Image provided in AVS. Patient verbalized understanding.   - D/c home stable, strict return precautions reviewed, follow-up as needed.  [facility] contact information provided to patient  for local OB/GYN services    and prenatal care.   ----- Adlynn Lowenstein, CNM Certified Nurse Midwife [facility]  Clinic OB[facility]N [facility]

## 2023-12-30 NOTE — Discharge Instructions (Signed)
   ItsBlog.fr.[id number]-efXb1zhTv-rC9bw2ZhWdeL-91W0dlSqOFltgLFXG1MUyYydunB83_aPbm62fyWPG4Vx2vIRMS5MqvYCzRE4wRR4bvPwBKQV51kLsJo7Yk78sZRF2q6ZdXO7YC-YczsSk6fW_mr7qEZDkMQJ_GWfQYqfPc_8tAqtT97uCgOpK8jjEwZ7AZhURYwNks1RQZBvV6p-hRoHgCpNHWeL1fqbHh0i45HobfTo9w.f27OrXk3MdFvh1eUvFs-nBfQxno2Ozi-wH5MxIkwXak&dib_tag=se&keywords=floradix&qid=201 [id number]&sprefix=Flurodix%2B%2Caps%2C491&sr=8-5&th=1

## 2024-01-17 ENCOUNTER — Observation Stay

## 2024-01-17 ENCOUNTER — Encounter: Payer: Self-pay | Admitting: Obstetrics and Gynecology

## 2024-01-17 ENCOUNTER — Other Ambulatory Visit: Payer: Self-pay

## 2024-01-17 ENCOUNTER — Observation Stay
Admission: EM | Admit: 2024-01-17 | Discharge: 2024-01-17 | Disposition: A | Discharge status: Home / Self Care | Attending: Obstetrics | Admitting: Obstetrics

## 2024-01-17 DIAGNOSIS — O99893 Other specified diseases and conditions complicating puerperium: Secondary | ICD-10-CM | POA: Insufficient documentation

## 2024-01-17 DIAGNOSIS — J019 Acute sinusitis, unspecified: Secondary | ICD-10-CM | POA: Insufficient documentation

## 2024-01-17 DIAGNOSIS — D649 Anemia, unspecified: Secondary | ICD-10-CM | POA: Insufficient documentation

## 2024-01-17 DIAGNOSIS — O26893 Other specified pregnancy related conditions, third trimester: Secondary | ICD-10-CM | POA: Diagnosis not present

## 2024-01-17 DIAGNOSIS — R102 Pelvic and perineal pain: Secondary | ICD-10-CM | POA: Insufficient documentation

## 2024-01-17 DIAGNOSIS — J302 Other seasonal allergic rhinitis: Secondary | ICD-10-CM | POA: Diagnosis not present

## 2024-01-17 DIAGNOSIS — R519 Headache, unspecified: Secondary | ICD-10-CM | POA: Insufficient documentation

## 2024-01-17 DIAGNOSIS — F419 Anxiety disorder, unspecified: Secondary | ICD-10-CM | POA: Diagnosis not present

## 2024-01-17 DIAGNOSIS — O99013 Anemia complicating pregnancy, third trimester: Secondary | ICD-10-CM | POA: Insufficient documentation

## 2024-01-17 DIAGNOSIS — Z3A33 33 weeks gestation of pregnancy: Secondary | ICD-10-CM | POA: Diagnosis not present

## 2024-01-17 DIAGNOSIS — O26833 Pregnancy related renal disease, third trimester: Secondary | ICD-10-CM | POA: Diagnosis not present

## 2024-01-17 DIAGNOSIS — O99343 Other mental disorders complicating pregnancy, third trimester: Secondary | ICD-10-CM | POA: Insufficient documentation

## 2024-01-17 DIAGNOSIS — K6289 Other specified diseases of anus and rectum: Secondary | ICD-10-CM | POA: Insufficient documentation

## 2024-01-17 DIAGNOSIS — O219 Vomiting of pregnancy, unspecified: Secondary | ICD-10-CM

## 2024-01-17 DIAGNOSIS — O26899 Other specified pregnancy related conditions, unspecified trimester: Principal | ICD-10-CM | POA: Diagnosis present

## 2024-01-17 LAB — URINALYSIS, ROUTINE W REFLEX MICROSCOPIC
Bilirubin Urine: NEGATIVE
Glucose, UA: NEGATIVE mg/dL
Hgb urine dipstick: NEGATIVE
Ketones, ur: NEGATIVE mg/dL
Leukocytes,Ua: NEGATIVE
Nitrite: NEGATIVE
Protein, ur: NEGATIVE mg/dL
Specific Gravity, Urine: 1.011 (ref 1.005–1.030)
pH: 8 (ref 5.0–8.0)

## 2024-01-17 MED ORDER — ZOLPIDEM TARTRATE 5 MG PO TABS: 5.0000 mg | ORAL_TABLET | Freq: Every evening | ORAL | Status: DC | PRN

## 2024-01-17 MED ORDER — ACETAMINOPHEN 500 MG PO TABS
1000.0000 mg | ORAL_TABLET | Freq: Once | ORAL | Status: AC
  Administered 2024-01-17: 1000 mg via ORAL
  Filled 2024-01-17: qty 2

## 2024-01-17 NOTE — Progress Notes (Signed)
 Pt presents to L/D triage with reported lower abdominal pain that is constant and has been ongoing for a few days.She reports pain 8/10. Pt appears flat in affect- reports no other issues at this time. Pt reports no bleeding or LOF and positive fetal movement.  RN encouraged pt to hydrate and to collect urine sample. CNM notified of patient's arrival.

## 2024-01-17 NOTE — OB Triage Note (Signed)
 Discharge instructions provided to pt after pt refused IV fluids and vaginal swabs. Vaginal bleeding and discharge, contractions, and fetal movement reviewed by RN. Follow-up care reviewed. Pt discharged home by self ambulatory.

## 2024-01-17 NOTE — Discharge Summary (Signed)
 TRIAGE NOTE to rule out Preterm Labor   History of Present Illness: Kara Nolan is a 23 year old G2P1000 at 33 weeks and 5 days, presenting to triage due to preterm pain. She reports experiencing lower abdominal pain along with pelvic and rectal pressure for several days. Additionally, she mentions having had one day of vaginal spotting. She indicates that she has been having intermittent Braxton Hicks contractions daily. Currently, her pain level is an 8 out of 10. She denies any vaginal discharge or odor. She consumes approximately 6 to 8 bottles of water each day. Furthermore, she denies any vaginal bleeding or fluid leakage. Earlier this morning, around 2 AM, she took 1000 mg of Tylenol  but did not find relief.  She reports no sexual intercourse since becoming pregnant.    Patient Active Problem List   Diagnosis Date Noted   Pelvic pressure in pregnancy, antepartum 12/30/2023   Abdominal cramping affecting pregnancy, antepartum 12/30/2023   Nausea and vomiting of pregnancy, antepartum 12/30/2023   Anemia affecting pregnancy in third trimester 12/30/2023   Supervision of normal pregnancy 09/07/2022   Seasonal allergies 04/17/2014   Acute sinus infection 07/27/2013   Stomach pain 05/09/2013   Suicidal ideation 04/06/2013   Anxiety state 04/06/2013   Chronic headache 08/26/2012   Anxiety as acute reaction to exceptional stress 10/08/2010    Past Medical History:  Diagnosis Date   Anxiety    Depression     Past Surgical History:  Procedure Laterality Date   MULTIPLE TOOTH EXTRACTIONS     WISDOM TOOTH EXTRACTION      OB History  Gravida Para Term Preterm AB Living  2 1 1      SAB IAB Ectopic Multiple Live Births          # Outcome Date GA Lbr Len/2nd Weight Sex Type Anes PTL Lv  2 Current           1 Term 01/26/23 [redacted]w[redacted]d           Social History   Socioeconomic History   Marital status: Single    Spouse name: Not on file   Number of children: Not on file    Years of education: Not on file   Highest education level: Not on file  Occupational History   Not on file  Tobacco Use   Smoking status: Never   Smokeless tobacco: Not on file  Vaping Use   Vaping status: Never Used  Substance and Sexual Activity   Alcohol use: No    Comment: Pt denied   Drug use: No    Comment: Pt denied   Sexual activity: Yes    Birth control/protection: None  Other Topics Concern   Not on file  Social History Narrative   Was living with mom and 4 brothers and 1 sister (all older).  Moved in with paternal grandmother for past month (April 2012).  Mom is unemployed.   Social Drivers of Corporate investment banker Strain: Low Risk  (07/14/2023)   Received from [facility]   Overall Financial Resource Strain (CARDIA)    Difficulty of Paying Living Expenses: Not hard at all  Food Insecurity: No Food Insecurity (10/18/2023)   Received from [facility]   Hunger Vital Sign    Within the past 12 months, you worried that your food would run out before you got the money to buy more.: Never true    Within the past 12 months, the food you bought just didn't last and you didn't  have money to get more.: Never true  Transportation Needs: No Transportation Needs (10/18/2023)   Received from [facility]   PRAPARE - Transportation    Lack of Transportation (Medical): No    Lack of Transportation (Non-Medical): No  Physical Activity: Unknown (08/19/2022)   Received from [facility]   Exercise Vital Sign    On average, how many days per week do you engage in moderate to strenuous exercise (like a brisk walk)?: 0 days    Minutes of Exercise per Session: Not on file  Recent Concern: Physical Activity - Inactive (08/19/2022)   Received from [facility]   Exercise Vital Sign    Days of Exercise per Week: 0 days    Minutes of Exercise per Session: 0 min  Stress: No Stress Concern Present (01/26/2023)   Received from [facility] - Occupational Stress Questionnaire    Feeling of Stress : Not at all  Social Connections: Socially Integrated (08/19/2022)   Received from [facility]   Social Network    How would you rate your social network (family, work, friends)?: Good participation with social networks    Family History  Problem Relation Age of Onset   Hypertension Paternal Grandmother    Heart disease Paternal Grandmother        mitral valve prolapse   Cancer Neg Hx    Stroke Neg Hx    Depression Neg Hx     No Known Allergies  Medications Prior to Admission  Medication Sig Dispense Refill Last Dose/Taking   Prenatal Vit-Fe Fumarate-FA (PRENATAL MULTIVITAMIN) TABS tablet Take 1 tablet by mouth daily at 12 noon. (Patient not taking: Reported on 12/30/2023)       Review of Systems - See HPI for OB specific ROS.   Vitals:  BP (!) 96/54 (BP Location: Left Arm)   Pulse 86   Temp 98.6 F (37 C) (Oral)   Resp 18   Ht 5' 4 (1.626 m)   Wt 56.7 kg   BMI 21.46 kg/m  Physical Examination: CONSTITUTIONAL: Well-developed, well-nourished female in no acute distress.  HENT:  Normocephalic, atraumatic EYES: Conjunctivae and EOM are normal. No scleral icterus.  NECK: Normal range of motion, supple, SKIN: Skin is warm and dry. No rash noted. Not diaphoretic. No erythema. No pallor. NEUROLGIC: Alert and oriented to person, place, and time. No gross cranial nerve deficit noted. PSYCHIATRIC: Normal mood and affect. Normal behavior. Normal judgment and thought content. CARDIOVASCULAR: Normal heart rate noted, regular rhythm RESPIRATORY: Effort and breath sounds normal, no problems with respiration noted ABDOMEN: Soft, nontender, nondistended, gravid.  Cervix: declined cervical exam  Membranes:intact Fetal Monitoring:Baseline: 150 bpm, Variability: Good {> 6 bpm), Accelerations: Reactive, and Decelerations: Absent Tocometer: UI with occasional contractions  Labs:  No results found for this or any previous  visit (from the past 24 hours).  Imaging Studies: No results found.   Assessment and Plan: Patient Active Problem List   Diagnosis Date Noted   Pelvic pressure in pregnancy, antepartum 12/30/2023   Abdominal cramping affecting pregnancy, antepartum 12/30/2023   Nausea and vomiting of pregnancy, antepartum 12/30/2023   Anemia affecting pregnancy in third trimester 12/30/2023   Supervision of normal pregnancy 09/07/2022   Seasonal allergies 04/17/2014   Acute sinus infection 07/27/2013   Stomach pain 05/09/2013   Suicidal ideation 04/06/2013   Anxiety state 04/06/2013   Chronic headache 08/26/2012   Anxiety as acute reaction to exceptional stress 10/08/2010    1.  UA sent 2. Wet prep/GC/CT declined 3.Pelvic exam and evaluation for preterm labor declined. 4.Limited OB U/S reassuring 5.Declined IV for Fluid bolus for intermittent uterine contractions 6. Educated patient on triage protocols and current risks for preterm labor such as close interval pregnancies, G1 delivered < 1 year ago. 7.. Patient discharged with precautions for f/u  Dr Leonce aware and agrees with plan  Bobbette Brunswick CNM

## 2024-02-01 ENCOUNTER — Inpatient Hospital Stay

## 2024-02-01 ENCOUNTER — Observation Stay

## 2024-02-01 ENCOUNTER — Observation Stay
Admission: EM | Admit: 2024-02-01 | Discharge: 2024-02-02 | Disposition: A | Discharge status: Home / Self Care | Attending: Obstetrics and Gynecology | Admitting: Obstetrics and Gynecology

## 2024-02-01 ENCOUNTER — Other Ambulatory Visit: Payer: Self-pay

## 2024-02-01 ENCOUNTER — Encounter: Payer: Self-pay | Admitting: Obstetrics and Gynecology

## 2024-02-01 DIAGNOSIS — U071 COVID-19: Secondary | ICD-10-CM | POA: Diagnosis not present

## 2024-02-01 DIAGNOSIS — O26899 Other specified pregnancy related conditions, unspecified trimester: Principal | ICD-10-CM

## 2024-02-01 DIAGNOSIS — R000 Tachycardia, unspecified: Secondary | ICD-10-CM | POA: Diagnosis present

## 2024-02-01 DIAGNOSIS — O368390 Maternal care for abnormalities of the fetal heart rate or rhythm, unspecified trimester, not applicable or unspecified: Secondary | ICD-10-CM | POA: Diagnosis present

## 2024-02-01 DIAGNOSIS — Z79899 Other long term (current) drug therapy: Secondary | ICD-10-CM | POA: Insufficient documentation

## 2024-02-01 DIAGNOSIS — O99343 Other mental disorders complicating pregnancy, third trimester: Secondary | ICD-10-CM | POA: Diagnosis not present

## 2024-02-01 DIAGNOSIS — R059 Cough, unspecified: Secondary | ICD-10-CM | POA: Insufficient documentation

## 2024-02-01 DIAGNOSIS — D563 Thalassemia minor: Secondary | ICD-10-CM | POA: Diagnosis not present

## 2024-02-01 DIAGNOSIS — O26893 Other specified pregnancy related conditions, third trimester: Secondary | ICD-10-CM | POA: Diagnosis not present

## 2024-02-01 DIAGNOSIS — O4703 False labor before 37 completed weeks of gestation, third trimester: Secondary | ICD-10-CM | POA: Diagnosis not present

## 2024-02-01 DIAGNOSIS — O4700 False labor before 37 completed weeks of gestation, unspecified trimester: Secondary | ICD-10-CM | POA: Diagnosis present

## 2024-02-01 DIAGNOSIS — O219 Vomiting of pregnancy, unspecified: Secondary | ICD-10-CM | POA: Diagnosis not present

## 2024-02-01 DIAGNOSIS — Z3A36 36 weeks gestation of pregnancy: Secondary | ICD-10-CM | POA: Insufficient documentation

## 2024-02-01 DIAGNOSIS — O99513 Diseases of the respiratory system complicating pregnancy, third trimester: Secondary | ICD-10-CM | POA: Insufficient documentation

## 2024-02-01 DIAGNOSIS — F419 Anxiety disorder, unspecified: Secondary | ICD-10-CM | POA: Diagnosis not present

## 2024-02-01 DIAGNOSIS — O2610 Low weight gain in pregnancy, unspecified trimester: Secondary | ICD-10-CM | POA: Diagnosis present

## 2024-02-01 DIAGNOSIS — O99013 Anemia complicating pregnancy, third trimester: Secondary | ICD-10-CM | POA: Diagnosis present

## 2024-02-01 DIAGNOSIS — O98513 Other viral diseases complicating pregnancy, third trimester: Secondary | ICD-10-CM | POA: Diagnosis present

## 2024-02-01 LAB — COMPREHENSIVE METABOLIC PANEL WITH GFR
ALT: 11 U/L (ref 0–44)
AST: 22 U/L (ref 15–41)
Albumin: 3.2 g/dL — ABNORMAL LOW (ref 3.5–5.0)
Alkaline Phosphatase: 100 U/L (ref 38–126)
Anion gap: 11 (ref 5–15)
BUN: 9 mg/dL (ref 6–20)
CO2: 21 mmol/L — ABNORMAL LOW (ref 22–32)
Calcium: 8.8 mg/dL — ABNORMAL LOW (ref 8.9–10.3)
Chloride: 101 mmol/L (ref 98–111)
Creatinine, Ser: 0.67 mg/dL (ref 0.44–1.00)
GFR, Estimated: >60 mL/min (ref >60)
Glucose, Bld: 78 mg/dL (ref 70–99)
Potassium: 3.8 mmol/L (ref 3.5–5.1)
Sodium: 133 mmol/L — ABNORMAL LOW (ref 135–145)
Total Bilirubin: 1.1 mg/dL (ref 0.0–1.2)
Total Protein: 7.4 g/dL (ref 6.5–8.1)

## 2024-02-01 LAB — URINALYSIS, COMPLETE (UACMP) WITH MICROSCOPIC
Bilirubin Urine: NEGATIVE
Glucose, UA: NEGATIVE mg/dL
Hgb urine dipstick: NEGATIVE
Ketones, ur: 20 mg/dL — AB
Leukocytes,Ua: NEGATIVE
Nitrite: NEGATIVE
Protein, ur: NEGATIVE mg/dL
Specific Gravity, Urine: 1.019 (ref 1.005–1.030)
pH: 6 (ref 5.0–8.0)

## 2024-02-01 LAB — CBC
HCT: 29.5 % — ABNORMAL LOW (ref 36.0–46.0)
Hemoglobin: 9.1 g/dL — ABNORMAL LOW (ref 12.0–15.0)
MCH: 22.3 pg — ABNORMAL LOW (ref 26.0–34.0)
MCHC: 30.8 g/dL (ref 30.0–36.0)
MCV: 72.3 fL — ABNORMAL LOW (ref 80.0–100.0)
Platelets: 303 K/uL (ref 150–400)
RBC: 4.08 MIL/uL (ref 3.87–5.11)
RDW: 15.6 % — ABNORMAL HIGH (ref 11.5–15.5)
WBC: 9.8 K/uL (ref 4.0–10.5)
nRBC: 0 % (ref 0.0–0.2)

## 2024-02-01 LAB — MAGNESIUM: Magnesium: 2 mg/dL (ref 1.7–2.4)

## 2024-02-01 LAB — ABO/RH: ABO/RH(D): B POS

## 2024-02-01 LAB — URINE DRUG SCREEN, QUALITATIVE (ARMC ONLY)
Amphetamines, Ur Screen: NOT DETECTED
Barbiturates, Ur Screen: NOT DETECTED
Benzodiazepine, Ur Scrn: NOT DETECTED
Cannabinoid 50 Ng, Ur ~~LOC~~: NOT DETECTED
Cocaine Metabolite,Ur ~~LOC~~: NOT DETECTED
MDMA (Ecstasy)Ur Screen: NOT DETECTED
Methadone Scn, Ur: NOT DETECTED
Opiate, Ur Screen: NOT DETECTED
Phencyclidine (PCP) Ur S: NOT DETECTED
Tricyclic, Ur Screen: NOT DETECTED

## 2024-02-01 LAB — OB RESULTS CONSOLE GC/CHLAMYDIA
Chlamydia: NEGATIVE
Neisseria Gonorrhea: NEGATIVE

## 2024-02-01 LAB — WET PREP, GENITAL
Clue Cells Wet Prep HPF POC: NONE SEEN
Sperm: NONE SEEN
Trich, Wet Prep: NONE SEEN
WBC, Wet Prep HPF POC: <10 (ref <10)
Yeast Wet Prep HPF POC: NONE SEEN

## 2024-02-01 LAB — RESP PANEL BY RT-PCR (RSV, FLU A&B, COVID)  RVPGX2
Influenza A by PCR: NEGATIVE
Influenza B by PCR: NEGATIVE
Resp Syncytial Virus by PCR: NEGATIVE
SARS Coronavirus 2 by RT PCR: POSITIVE — AB

## 2024-02-01 LAB — RAPID HIV SCREEN (HIV 1/2 AB+AG)
HIV 1/2 Antibodies: NONREACTIVE
HIV-1 P24 Antigen - HIV24: NONREACTIVE

## 2024-02-01 LAB — CHLAMYDIA/NGC RT PCR (ARMC ONLY)
Chlamydia Tr: NOT DETECTED
N gonorrhoeae: NOT DETECTED

## 2024-02-01 LAB — GROUP B STREP BY PCR: Group B strep by PCR: NEGATIVE

## 2024-02-01 LAB — TYPE AND SCREEN
ABO/RH(D): B POS
Antibody Screen: NEGATIVE

## 2024-02-01 LAB — LACTIC ACID, PLASMA: Lactic Acid, Venous: 1.4 mmol/L (ref 0.5–1.9)

## 2024-02-01 LAB — OB RESULTS CONSOLE HIV ANTIBODY (ROUTINE TESTING): HIV: NONREACTIVE

## 2024-02-01 LAB — FERRITIN: Ferritin: 45 ng/mL (ref 11–307)

## 2024-02-01 MED ORDER — CYCLOBENZAPRINE HCL 5 MG PO TABS
ORAL_TABLET | ORAL | Status: AC
  Administered 2024-02-01: 5 mg via ORAL
  Filled 2024-02-01: qty 1

## 2024-02-01 MED ORDER — ONDANSETRON HCL 4 MG/2ML IJ SOLN
4.0000 mg | Freq: Once | INTRAMUSCULAR | Status: DC
  Filled 2024-02-01: qty 2

## 2024-02-01 MED ORDER — ACETAMINOPHEN 325 MG PO TABS
650.0000 mg | ORAL_TABLET | ORAL | Status: DC | PRN
  Administered 2024-02-01: 650 mg via ORAL
  Filled 2024-02-01: qty 2

## 2024-02-01 MED ORDER — ACETAMINOPHEN 500 MG PO TABS
1000.0000 mg | ORAL_TABLET | Freq: Once | ORAL | Status: AC
  Administered 2024-02-01: 1000 mg via ORAL
  Filled 2024-02-01: qty 2

## 2024-02-01 MED ORDER — SODIUM CHLORIDE 0.9 % IV BOLUS
1000.0000 mL | Freq: Once | INTRAVENOUS | Status: AC
  Administered 2024-02-01: 1000 mL via INTRAVENOUS

## 2024-02-01 MED ORDER — ONDANSETRON HCL 4 MG/2ML IJ SOLN
4.0000 mg | Freq: Four times a day (QID) | INTRAMUSCULAR | Status: DC | PRN
  Administered 2024-02-01: 4 mg via INTRAVENOUS

## 2024-02-01 MED ORDER — SODIUM CHLORIDE 0.9 % IV SOLN: INTRAVENOUS | Status: DC

## 2024-02-01 MED ORDER — PRENATAL MULTIVITAMIN CH
1.0000 | ORAL_TABLET | Freq: Every day | ORAL | Status: DC
  Administered 2024-02-02: 1 via ORAL
  Filled 2024-02-01: qty 1

## 2024-02-01 MED ORDER — CALCIUM CARBONATE ANTACID 500 MG PO CHEW: 2.0000 | CHEWABLE_TABLET | ORAL | Status: DC | PRN

## 2024-02-01 MED ORDER — DIPHENHYDRAMINE HCL 50 MG/ML IJ SOLN
12.5000 mg | Freq: Once | INTRAMUSCULAR | Status: AC
  Administered 2024-02-01: 12.5 mg via INTRAVENOUS
  Filled 2024-02-01: qty 1

## 2024-02-01 MED ORDER — LACTATED RINGERS IV SOLN
125.0000 mL/h | INTRAVENOUS | Status: AC
  Administered 2024-02-02: 100 mL/h via INTRAVENOUS
  Administered 2024-02-02: 125 mL/h via INTRAVENOUS

## 2024-02-01 MED ORDER — CYCLOBENZAPRINE HCL 5 MG PO TABS
5.0000 mg | ORAL_TABLET | Freq: Once | ORAL | Status: AC
  Administered 2024-02-01: 5 mg via ORAL
  Filled 2024-02-01: qty 1

## 2024-02-01 NOTE — H&P (Addendum)
 OB/GYN Labor and Delivery H&P Name: Kara Nolan July 10, 2000 Date: 02/01/2024   Time: 6:07 PM   Room/Bed: OBS1/OBS1   CC: contractions, back pain, cough, nausea/ vomiting, and chills.  Subjective   HPI: Kara Nolan is a 23 y.o. G2P1001 at [redacted]w[redacted]d by -/7 who presents to triage for evaluation of contractions, back pain, cough, nausea/ vomiting, and chills.  Reports was feeling normal yesterday other than contractions- painful contractions (every 10-15 min). Woke up at 2 AM with pain in butt and back. Pain has been constant since then. Crampy and dull pain. Reports nausea and vomiting- 5 episodes. Ate breakfast like normal but has not eaten since. Reports chills and sweats. No sick contacts. Lives with husband and daughter and they are well. Denies dysuria, hematuria, . +Cough starting today. Denies sore throat, runny nose, diarrhea, constipation. Last BM yesteday. Headache 6/10. Denies vision changes, CP, SOB, RUQ pain. Denies chronic headaches  Reports painful contractions every 5-10 minutes today. Denies VB and leakage of fluid. Reports decreased FM today.  Lives in [place] but gets her care in [place]- reports she has been going there forever.   Upon arrival vitals notable for BP 75/42 and HR 135 with temp 99.8%. HR improved to 110-120s and BP to 100s/50s.    Her pregnancy is complicated by: Underweight- BMI 17 prior to pregnancy Anemia Beta thalassemia minor Short interval pregnancy Anxiety  Denies substance use including tobacco and marijuana.   PMH: anxiety PSH: none  ROS: Negative except that noted in HPI.   Review of Systems  Constitutional:  Positive for chills. Negative for fever.  HENT:  Negative for sore throat.   Eyes:  Negative for blurred vision.  Respiratory:  Positive for cough. Negative for shortness of breath.   Gastrointestinal:  Positive for abdominal pain, nausea and vomiting. Negative for constipation and diarrhea.  Genitourinary:  Negative for  dysuria.  Musculoskeletal:  Positive for back pain.  Skin:  Negative for rash.  Neurological:  Positive for headaches.     PRENATAL CARE PROVIDER: [facility] in [place]   OBHx:    MAINE History  Gravida Para Term Preterm AB Living  2 1 1   1   SAB IAB Ectopic Multiple Live Births          # Outcome Date GA Lbr Len/2nd Weight Sex Type Anes PTL Lv  2 Current           1 Term 01/26/23 [redacted]w[redacted]d            PROBLEM LIST:  Patient Active Problem List   Diagnosis Date Noted   Abdominal pain affecting pregnancy, antepartum 01/17/2024   Pelvic pressure in pregnancy, antepartum 12/30/2023   Abdominal cramping affecting pregnancy, antepartum 12/30/2023   Nausea and vomiting of pregnancy, antepartum 12/30/2023   Anemia affecting pregnancy in third trimester 12/30/2023   Supervision of normal pregnancy 09/07/2022   Seasonal allergies 04/17/2014   Acute sinus infection 07/27/2013   Stomach pain 05/09/2013   Suicidal ideation 04/06/2013   Anxiety state 04/06/2013   Chronic headache 08/26/2012   Anxiety as acute reaction to exceptional stress 10/08/2010     PMHx:    Past Medical History:  Diagnosis Date   Anxiety    Depression      PSHx:    Past Surgical History:  Procedure Laterality Date   MULTIPLE TOOTH EXTRACTIONS     WISDOM TOOTH EXTRACTION       MEDS:    No current facility-administered medications on file prior  to encounter.   Current Outpatient Medications on File Prior to Encounter  Medication Sig Dispense Refill   Prenatal Vit-Fe Fumarate-FA (PRENATAL MULTIVITAMIN) TABS tablet Take 1 tablet by mouth daily at 12 noon.       ALLERGIES:   No Known Allergies   FAMILY Hx:    Family History  Problem Relation Age of Onset   Hypertension Paternal Grandmother    Heart disease Paternal Grandmother        mitral valve prolapse   Cancer Neg Hx    Stroke Neg Hx    Depression Neg Hx      SOCIAL Hx:    Social History   Tobacco Use   Smoking status: Never    Smokeless tobacco: Not on file  Substance Use Topics   Alcohol use: No    Comment: Pt denied     Objective   Vitals: BP 96/62   Pulse (!) 124   Temp 99.8 F (37.7 C) (Oral)   SpO2 100%  There is no height or weight on file to calculate BMI.   Physical exam:  Gen-  Alert and interactive, NAD.  Back- No CVA tenderness CV-   Tachycardic, regular rhythm PULM-   CTAB, no crackles, wheezing, rhonchi, respirations unlabored on room air ABD-   Gravid, soft, nontender, no rebound or guarding Ext-   No LE edema   Chaperone- Harlene Shorter RN present   Sterile Speculum Exam: Declined   Patient self collected wet prep, GC/CT, and GBS.   Cervical Exam: 1/50/-3   NST: FHTs-  Initially- Bl 190, moderate variability, + accels, +variable decels  Currently- Bl 160, moderate variability, + accels, no decels  TOCO- 2-39min   Prenatal Labs:    Blood type/Rh B+  Antibody screen Negative    Hb 10.8 (07/27/23) > 9.7 (11/19/23)  Plt 357 (07/27/23) >301 (11/19/23)  Rubella Immune  Varicella Unknown  RPR Neg  HBsAg Neg  Hep C Neg  HIV Neg  GC Neg  Chlamydia Neg  UCx Neg  Genetic screening cfDNA negative   1 hour GTT 93  A1c 5.0     Assessment & Plan   Kara Nolan is a 23 y.o. G2P1001 at [redacted]w[redacted]d by -/7 who presents to triage for evaluation of contractions, back pain, cough, nausea/ vomiting, and chills. Vitals notable for tachycardia and hypotension with elevated temperature (but afebrile technically). Fetal tachycardia present. Patient is overall well appearing with a normal exam other than 1 cm dilated. Suspect patient has an acute viral illness leading to dehydration and preterm contractions. Will evaluate and treat accordingly and would expect improvement in FHT.    #Preterm contractions #Back pain, nausea/ vomiting, chills, cough #Tachycardia - Admit to L&D for observation - Regular diet - Continuous pulse ox and vitals q4h - CEFM and toco - Cervix: 1/50/-3. -  Declined SSE. Patient self collected swabs. - IVF 1 L NS bolus > followed by NS @ 100cc/hr - Labs: CBC, CMP, Mg, lactic acid, UA, Ucx, Bcx, wet prep, GC/CT, UDS, COVID/flu/RSV - TVUS for CL ordered - CXR - EKG - PO tylenol  1000mg  and Flexeril  10 mg x 1 - IV Zofran  4 mg q6h PRN   #ROB - PNV - Rh+ - Recommended transfer of care to [facility] if planning to deliver here in town. Message sent to clinic to schedule transfer visit.  - Varicella IgG, HIV, and RPR ordered - GBS ordered  #Anemia #Beta thalassemia minor - Hb 9.7 on 11/19/23 - Has not received  any treatment - Repeat CBC ordered and ferritin and if iron deficient, will order IV iron  #Fetal tachycardia #Fetal wellbeing - Continuous fetal monitoring. Cat 2 due to fetal tachycardia- already improved since arrival. - Suspect fetal tachycardia is due to acute maternal illness and dehydration. Will treat maternal illness and dehydration.  - IVF as above - BPP ordered to assess fetal wellbeing - Needs growth ultrasound low maternal BMI, short interval pregnancy, and poor maternal weight gain- if unable to obtain inpatient will arrange for growth outpatient  #Poor maternal weight gain #Underweight - Pre-pregnancy BMI 17 - TWG 22 lbs - Nutrition consult placed  #PPX: SCDs ordered    Beverli LULLA Dinsmore, MD 02/01/2024 6:07 PM

## 2024-02-01 NOTE — OB Triage Note (Addendum)
 Pt G2P1 [redacted]w[redacted]d presents for abd pain since this morning. Pt reports pain in her back, belly, and butt. Pt reports ctx have got stronger and closer together. Reports feeling hot and having chills along with nausea, vomiting, cough and body aches. +FM. Denies LOF/bleeding. Initial FHT 205. Denies being around anyone sick. BP cycling. Pt reports eating breakfast and having no appetite since. 1 episode of vomiting around 11ish.

## 2024-02-02 DIAGNOSIS — U071 COVID-19: Secondary | ICD-10-CM | POA: Diagnosis present

## 2024-02-02 DIAGNOSIS — O98513 Other viral diseases complicating pregnancy, third trimester: Secondary | ICD-10-CM | POA: Diagnosis not present

## 2024-02-02 MED ORDER — BOOST / RESOURCE BREEZE PO LIQD CUSTOM: 1.0000 | Freq: Three times a day (TID) | ORAL | Status: DC

## 2024-02-02 MED ORDER — CYCLOBENZAPRINE HCL 10 MG PO TABS
10.0000 mg | ORAL_TABLET | Freq: Every day | ORAL | 0 refills | Status: DC
Start: 2024-02-02 — End: 2024-02-16

## 2024-02-02 MED ORDER — SODIUM CHLORIDE 0.9 % IV SOLN
25.0000 mg | Freq: Once | INTRAVENOUS | Status: AC
  Administered 2024-02-02: 25 mg via INTRAVENOUS
  Filled 2024-02-02: qty 1

## 2024-02-02 MED ORDER — GUAIFENESIN 100 MG/5ML PO LIQD
5.0000 mL | ORAL | Status: DC | PRN
  Administered 2024-02-02: 5 mL via ORAL
  Filled 2024-02-02: qty 5
  Filled 2024-02-02: qty 10
  Filled 2024-02-02: qty 5

## 2024-02-02 MED ORDER — OXYCODONE HCL 5 MG PO TABS
5.0000 mg | ORAL_TABLET | Freq: Once | ORAL | Status: AC
  Administered 2024-02-02: 5 mg via ORAL
  Filled 2024-02-02: qty 1

## 2024-02-02 MED ORDER — NIRMATRELVIR/RITONAVIR (PAXLOVID)TABLET
3.0000 | ORAL_TABLET | Freq: Two times a day (BID) | ORAL | Status: DC
Start: 2024-02-02 — End: 2024-02-02
  Administered 2024-02-02 (×2): 3 via ORAL
  Filled 2024-02-02: qty 3

## 2024-02-02 MED ORDER — MORPHINE SULFATE (PF) 4 MG/ML IV SOLN
3.0000 mg | Freq: Once | INTRAVENOUS | Status: AC
  Administered 2024-02-02: 3 mg via INTRAVENOUS
  Filled 2024-02-02: qty 1

## 2024-02-02 MED ORDER — MELATONIN 5 MG PO TABS
5.0000 mg | ORAL_TABLET | Freq: Once | ORAL | Status: AC
  Administered 2024-02-02: 5 mg via ORAL
  Filled 2024-02-02: qty 1

## 2024-02-02 MED ORDER — NIRMATRELVIR/RITONAVIR (PAXLOVID)TABLET
3.0000 | ORAL_TABLET | Freq: Two times a day (BID) | ORAL | Status: DC
  Filled 2024-02-02: qty 3

## 2024-02-02 MED ORDER — CYCLOBENZAPRINE HCL 5 MG PO TABS
10.0000 mg | ORAL_TABLET | Freq: Once | ORAL | Status: AC
  Administered 2024-02-02: 10 mg via ORAL
  Filled 2024-02-02: qty 2

## 2024-02-02 MED ORDER — NON FORMULARY: Freq: Two times a day (BID) | Status: DC

## 2024-02-02 MED ORDER — ACETAMINOPHEN 500 MG PO TABS
1000.0000 mg | ORAL_TABLET | Freq: Four times a day (QID) | ORAL | Status: DC | PRN
  Administered 2024-02-02: 1000 mg via ORAL
  Filled 2024-02-02: qty 2

## 2024-02-02 MED ORDER — SODIUM CHLORIDE 0.9 % IV BOLUS
250.0000 mL | Freq: Once | INTRAVENOUS | Status: AC
  Administered 2024-02-02: 250 mL via INTRAVENOUS

## 2024-02-02 MED ORDER — MENTHOL 3 MG MT LOZG
1.0000 | LOZENGE | OROMUCOSAL | Status: DC | PRN
  Administered 2024-02-02: 3 mg via ORAL
  Filled 2024-02-02: qty 9

## 2024-02-02 MED ORDER — GUAIFENESIN 100 MG/5ML PO LIQD: 5.0000 mL | ORAL | 0 refills | Status: DC | PRN

## 2024-02-02 MED ORDER — PANTOPRAZOLE SODIUM 40 MG PO TBEC
40.0000 mg | DELAYED_RELEASE_TABLET | Freq: Once | ORAL | Status: AC
  Administered 2024-02-02: 40 mg via ORAL
  Filled 2024-02-02: qty 1

## 2024-02-02 MED ORDER — MORPHINE SULFATE (PF) 4 MG/ML IV SOLN: 3.0000 mg | Freq: Once | INTRAVENOUS | Status: DC

## 2024-02-02 MED ORDER — DIPHENHYDRAMINE HCL 25 MG PO CAPS: 25.0000 mg | ORAL_CAPSULE | Freq: Four times a day (QID) | ORAL | Status: DC | PRN

## 2024-02-02 NOTE — Progress Notes (Signed)
 Discharge/medication instructions provided to pt. Pt verbalizes understanding. Vaginal bleeding and discharge, contractions, and fetal movement reviewed by RN. Follow-up care reviewed. Pt discharged home ambulatory.

## 2024-02-02 NOTE — Discharge Instructions (Signed)
 This nutrition therapy will help you with the nausea and vomiting experienced during pregnancy. Everyone's body is different. You may want to try the following tips to see if they work for you: Try to eat 6 small meals/snacks during the day. Small meals may be easier to tolerate than large meals. Keep easy to digest foods, such as crackers and pretzels, with you during the day and at your bedside. You may even try eating a few crackers before getting out of bed in the morning. Drink water or other beverages (caffeine-free) between meals. Eating ginger may improve nausea. Lower-fat foods are easier to digest. High-fat foods can make nausea worse.  Foods Recommended You may eat any foods except those on the Foods Not Recommended list. Eat when you feel hungry. Foods without much smell may be easier for you to tolerate. The following foods may be easier to eat when you feel nauseous: Cold foods such as ice cream, smoothies, popsicles, or frozen fruit Warm foods such as mashed or baked potatoes, soups, or toast Spicy foods such as salsa, gingersnaps, gingerbread, or curries Tart/sour foods such as tomato or vegetable juice, dill pickles, lemons/lemonade, limes/limeade, or other citrus fruits and juices Creamy foods such as low-fat milk, custards, puddings, or yogurt Crunchy foods such as raw vegetables (particularly carrots and celery), pretzels, raw fruits (particularly apples or pears), nuts, crackers, or dry cereal Soft foods such as cake, cottage cheese, cooked carrots, or green beans Beverages and liquid foods such as fruit juice, water, gelatin, smoothies, or broth Salty foods such as chips, salted top crackers, dip, pizza, or tomato or vegetable juice Foods made with chocolate such as chocolate milk, pudding, or ice cream, or fudge sauce for dipping fruit or crackers.  Foods Not Recommended Beverages Alcohol Excessive caffeine Some herbal teas: avoid drinking herbal teas. If you must,  speak with your doctor before drinking. Unpasteurized cider and juices Meat and Poultry Raw or uncooked meats, fish, poultry, or eggs Foods high in mercury: Shark Swordfish King mackerel Tilefish Limit all other fish (including tuna) to 12 ounces  or less per week Hot dogs, luncheon meats, bologna, or other deli meats unless they are heated until steaming hot Smoked fish  Dairy Products Raw or unpasteurized milk; cheese and dairy products made with raw or unpasteurized milk Soft serve yogurt Soft cheese like brie Blue cheese like gorgonzola Fruits and Vegetables Raw sprouts Other Items to Avoid Tobacco Illicit drugs Herbal remedies or supplements. Discuss any teas, herbs, or home remedies you do use with your doctor to be sure they are safe for you and your baby. Vitamin or mineral supplements other than those recommended or prescribed by your doctor, nurse practitioner, or midwife.  Sample 1-Day Menu  Breakfast 1 cup cooked oatmeal 1/4 cup sliced unsalted almonds 1/2 cup sliced strawberries 1 teaspoon margarine-substitute 1 cup low-fat milk 1.5 cups seltzer water or ginger tea  Morning Snack 8 saltine crackers 1.5 oz cheddar cheese  Lunch 3/4 cup low-fat cottage cheese 1 cup cubed cantaloupe 6 carrot sticks 1.5 cups seltzer water or ginger tea  Afternoon Snack 1 oz pretzels 1 tablespoon peanut butter  Evening Meal 3 oz cooked skinless chicken 1 medium baked potato 1/2 cup cooked carrots 1/2 cup chocolate pudding 1/2 cup 100% fruit juice  Evening Snack 1 slice whole wheat bread 2 tablespoons low-fat cream cheese 1/2 cup aliced cucumber 1/2 cup sliced tomatoes 1.5 cups plain or seltzer water     Sample 1-Day Menu  Breakfast 1 cup  cooked oatmeal  cup almonds 1 cup strawberries 1 cup soymilk fortified with calcium , vitamin B12, and vitamin D 1.5 cups seltzer water or ginger tea  Morning Snack 10 saltine crackers  cup hummus  cup apple juice  Lunch  cup  white beans 1 cup cantaloupe 6 carrot sticks 2 tablespoons salsa 1.5 cups seltzer water or ginger tea  Afternoon Snack 1 ounce pretzels 1 tablespoon peanut butter  Evening Meal 1 black bean burger 1 whole wheat bun 2 slices tomato 4 slices pickles 1 teaspoon mustard  cup mashed potatoes  cup green beans 2 teaspoons olive oil  Evening Snack 6 ounces soy yogurt 1.5 cups water     Sample 1-Day Menu  Breakfast 1 cup cooked oatmeal  cup almonds 1 cup strawberries 1 cup 1% milk 1.5 cups seltzer water or ginger tea  Morning Snack 10 saltine crackers  cup hummus  cup apple juice  Lunch  cup low-fat cottage cheese 1 cup cantaloupe 6 carrot sticks 1.5 cups seltzer water or ginger tea  Afternoon Snack 1 ounce pretzels 1 tablespoon peanut butter  Evening Meal 1 black bean burger 1 whole wheat bun 2 slices tomato 4 slices pickles 1 teaspoon mustard  cup mashed potatoes  cup green beans 2 teaspoons olive oil  Evening Snack 8 ounces low-fat yogurt 1.5 cups water

## 2024-02-02 NOTE — Progress Notes (Signed)
 OBGYN Antepartum Note 02/02/2024 7:13 AM   Patient Name: Kara Nolan MRN: [id number] DOB: 2000-10-19   Admission date: 02/01/2024 LOS: 1   HPI: Spoke to RN who reports patient was able to sleep for hours after treatment of pain with one time dose of IV morphine . Resting comfortably.    OB History: OB History  Gravida Para Term Preterm AB Living  2 1 1   1   SAB IAB Ectopic Multiple Live Births          # Outcome Date GA Lbr Len/2nd Weight Sex Type Anes PTL Lv  2 Current           1 Term 01/26/23 [redacted]w[redacted]d            PHYSICAL EXAM: Temp:  [98.9 F (37.2 C)-99.8 F (37.7 C)] 98.9 F (37.2 C) (07/30 0026) Pulse Rate:  [100-135] 100 (07/30 0444) BP: (75-109)/(42-62) 95/49 (07/30 0444) SpO2:  [99 %-100 %] 100 % (07/30 0250)   GBS result: neg   Last Cervical Exam: 1/50/-3 (7/29 at 1820)   CEFM: Baseline 160, moderate varaibility, + accels, no decels  Toco: irritability   Recent Labs    02/01/24 1816  WBC 9.8  HGB 9.1*  HCT 29.5*  MCV 72.3*  PLT 303   Recent Labs    02/01/24 1816  NA 133*  K 3.8  CL 101  CO2 21*  CREATININE 0.67  BUN 9  CALCIUM  8.8*  ALT 11  AST 22    - Work-up:  CBC- wnl other than anemia  Ferritin 45 CMP wnl Mg 2.0  Lactic acid 1.4  UA wnl Wet prep neg  GC/CT neg UDS neg COVID/flu/RSV- + COVID CXR wnl   ASSESSMENT/PLAN: Kara Nolan is a 23 y.o. G2P1001 at [redacted]w[redacted]d by -/7 who is admitted for fetal and maternal tachycardia and preterm contractions in the setting of COVID.   #COVID #Preterm contractions #Tachycardia, improved - Admitted to L&D for observation - Regular diet - Continuous pulse ox and vitals q4h - CEFM and toco - Cervix: 1/50/-3 on admission. Contractions spaced out significantly since admission. Declined CL. Plan for cervical recheck today.  - CXR wnl. EKG sinus tachycardia and possible left atrial enlargement- repeat EKG ordered.  - S/p IVF 1.5 L NS bolus > followed by NS @ 100cc/hr - AM CBC and BMP  ordered - Paxlovid  BID x 5 days - Symptomatic treatment: PO tylenol  650 mg q4h PRN, Flexeril  10 mg PRN, IV Zofran  4 mg q6h PRN, diphenhydramine  25 mg q6h PRN, guifenesin 100 mg q4h PRN  #Fetal tachycardia #Fetal wellbeing - Continuous fetal monitoring. Initially non-reassuring due to persistent fetal tachycardia but improved since admission. Intermittent fetal tachycardia- overall improved.  - Continue IVF as above - BPP 8/8 on admission - Plan for growth ultrasound low maternal BMI, short interval pregnancy, and poor maternal weight gain- will discuss with MFM today to see if can be performed inpatient, if not plan for outpatient in the next few days   #Anemia #Beta thalassemia minor - Hb 9.7 on 11/19/23> 9.1, ferritin 45 (02/01/24) - Consider hematology consult   #Poor maternal weight gain #Underweight - Pre-pregnancy BMI 17 - TWG 22 lbs - Nutrition consult placed  #ROB - PNV - Rh+ - Recommended transfer of care to [facility] if planning to deliver here in town. Message sent to clinic to schedule transfer visit.  - Varicella IgG in process - GBS neg   #PPX: SCDs ordered   Dispo: Continue observation  Electronically signed by:  Beverli LULLA Dinsmore, MD, 02/02/2024, 7:13 AM

## 2024-02-02 NOTE — Progress Notes (Addendum)
 Initial Nutrition Assessment  DOCUMENTATION CODES:   Not applicable  INTERVENTION:   -Continue regular diet -Continue prenatal MVI -Boost Breeze po TID, each supplement provides 250 kcal and 9 grams of protein  -Magic cup TID with meals, each supplement provides 290 kcal and 9 grams of protein  -RD provided Morning Sickness Nutrition Therapy handout from [facility] (due to pt complaint of having difficulty keeping foods down for 2 days PTA); attached to AVS/ discharge summary   NUTRITION DIAGNOSIS:   Inadequate oral intake related to poor appetite as evidenced by per patient/family report.  GOAL:   Patient will meet greater than or equal to 90% of their needs  MONITOR:   PO intake, Supplement acceptance  REASON FOR ASSESSMENT:   Consult Diet education  ASSESSMENT:   G2P1001 at [redacted]w[redacted]d by -/7 who presents to triage for evaluation of contractions, back pain, cough, nausea/ vomiting, and chills. Vitals notable for tachycardia and hypotension with elevated temperature (but afebrile technically). Fetal tachycardia present. Patient is overall well appearing with a normal exam other than 1 cm dilated. Suspect patient has an acute viral illness leading to dehydration and preterm contractions. Will evaluate and treat accordingly and would expect improvement in FHT  Pt admitted with preterm contractions.   RD consulted by MD due to concern of poor pregnancy weight gain and pregravid BMI of 17. Pregravid wt 102# on 07/01/23 (approximately 25# wt gain- expected pregnancy weight gain for pregravid underweight status is 35-40#). Of note, pt delivered last child on 01/26/23. She has been refusing vaginal exams.   Pt with multiple hospitalizations this pregnancy including: 08/22/23 for nausea and vomiting (left AMA) and 09/29/23 for fall. Pt has been admitted for pain 3 times within the past month.   Pt sitting up in bed at time of visit. Pt minimally interactive with this RD. She  did not make eye contact with this RD and was watching videos on her phone throughout interview. Pt mainly responded to close ended questions despite probing. Noted pt to be small framed/ stature.   Observed breakfast tray at bedside- she consumed 50% of hot tea, a few bites of muffin, and a fruit cup. Per pt, she consumed the fruit cup, but did not eat the other items on the tray because the rest of it was nasty. RD offered to provide an additional tray of foods she might like; RD ordered pt another fruit cup per her request.   Pt shares she has had difficulty keeping foods down for the past 2 days PTA, but has been drinking a lot. Prior to this, she reports her appetite was good and I was eating normal. Unable to obtain diet recall despite probing. Pt reports she eats all the time at baseline, but would not provide RD with diet recall, food/ meal frequency, or foods she typically eats/ likes.   Medications reviewed and include 0.9% sodium chloride  infusion @ 100 ml/hr.   Labs reviewed: Na: 133. Tox screen negative.   NUTRITION - FOCUSED PHYSICAL EXAM:  Flowsheet Row Most Recent Value  Orbital Region No depletion  Upper Arm Region No depletion  Thoracic and Lumbar Region No depletion  Buccal Region No depletion  Temple Region No depletion  Clavicle Bone Region Mild depletion  Clavicle and Acromion Bone Region No depletion  Scapular Bone Region No depletion  Dorsal Hand No depletion  Patellar Region No depletion  Anterior Thigh Region No depletion  Posterior Calf Region No depletion  Edema (RD Assessment) None  Hair Reviewed  Eyes Reviewed  Mouth Reviewed  Skin Reviewed  Nails Reviewed    Diet Order:   Diet Order             Diet regular Room service appropriate? Yes; Fluid consistency: Thin  Diet effective now                   EDUCATION NEEDS:   Not appropriate for education at this time  Skin:  Skin Assessment: Reviewed RN Assessment  Last BM:   Unknown  Height:   Ht Readings from Last 1 Encounters:  02/02/24 5' 4 (1.626 m)    Weight:   Wt Readings from Last 1 Encounters:  02/02/24 57.6 kg    Ideal Body Weight:  54.5 kg  BMI:  Body mass index is 21.8 kg/m.  Estimated Nutritional Needs:   Kcal:  1850-2050  Protein:  90-105 grams  Fluid:  1.8-2.0 L    [name], RD, LDN, CDCES Registered Dietitian III Certified Diabetes Care and Education Specialist If unable to reach this RD, please use RD Inpatient group chat on secure chat between hours of 8am-4 pm daily

## 2024-02-02 NOTE — Discharge Summary (Signed)
 Patient ID: Kara Nolan MRN: [id number] DOB/AGE: 22-02-2001 22 y.o.  Admit date: 02/01/2024 Discharge date: 02/02/2024  Admission Diagnoses: 23yo G2P1 at [redacted]w[redacted]d presents for evaluation of contractions, back pain, cough, nausea/ vomiting, and chills .   Discharge Diagnoses: Covid  Factors complicating pregnancy: Low maternal weight gain Covid in third trimester Beta thalassemia minor Anxiety  Prenatal Procedures: NST  Consults: None  Significant Diagnostic Studies:  Results for orders placed or performed during the hospital encounter of 02/01/24 (from the past week)  Culture, blood (Routine X 2) w Reflex to ID Panel   Collection Time: 02/01/24  6:12 PM   Specimen: BLOOD  Result Value Ref Range   Specimen Description BLOOD LEFT ANTECUBITAL    Special Requests      BOTTLES DRAWN AEROBIC AND ANAEROBIC Blood Culture adequate volume   Culture      NO GROWTH < 12 HOURS Performed at [facility], [address] [place], [place] [address]    Report Status PENDING   Rapid HIV screen (HIV 1/2 Ab+Ag)   Collection Time: 02/01/24  6:12 PM  Result Value Ref Range   HIV-1 P24 Antigen - HIV24 NON REACTIVE NON REACTIVE   HIV 1/2 Antibodies NON REACTIVE NON REACTIVE   Interpretation (HIV Ag Ab)      A non reactive test result means that HIV 1 or HIV 2 antibodies and HIV 1 p24 antigen were not detected in the specimen.  Magnesium   Collection Time: 02/01/24  6:12 PM  Result Value Ref Range   Magnesium 2.0 1.7 - 2.4 mg/dL  Ferritin   Collection Time: 02/01/24  6:12 PM  Result Value Ref Range   Ferritin 45 11 - 307 ng/mL  Culture, blood (Routine X 2) w Reflex to ID Panel   Collection Time: 02/01/24  6:13 PM   Specimen: BLOOD  Result Value Ref Range   Specimen Description BLOOD RIGHT ANTECUBITAL    Special Requests      BOTTLES DRAWN AEROBIC AND ANAEROBIC Blood Culture adequate volume   Culture      NO GROWTH < 12 HOURS Performed at [facility], [address] [place], [place] [address]    Report Status PENDING   CBC   Collection Time: 02/01/24  6:16 PM  Result Value Ref Range   WBC 9.8 4.0 - 10.5 K/uL   RBC 4.08 3.87 - 5.11 MIL/uL   Hemoglobin 9.1 (L) 12.0 - 15.0 g/dL   HCT 70.4 (L) 63.9 - 53.9 %   MCV 72.3 (L) 80.0 - 100.0 fL   MCH 22.3 (L) 26.0 - 34.0 pg   MCHC 30.8 30.0 - 36.0 g/dL   RDW 84.3 (H) 88.4 - 84.4 %   Platelets 303 150 - 400 K/uL   nRBC 0.0 0.0 - 0.2 %  Comprehensive metabolic panel   Collection Time: 02/01/24  6:16 PM  Result Value Ref Range   Sodium 133 (L) 135 - 145 mmol/L   Potassium 3.8 3.5 - 5.1 mmol/L   Chloride 101 98 - 111 mmol/L   CO2 21 (L) 22 - 32 mmol/L   Glucose, Bld 78 70 - 99 mg/dL   BUN 9 6 - 20 mg/dL   Creatinine, Ser 9.32 0.44 - 1.00 mg/dL   Calcium  8.8 (L) 8.9 - 10.3 mg/dL   Total Protein 7.4 6.5 - 8.1 g/dL   Albumin 3.2 (L) 3.5 - 5.0 g/dL   AST 22 15 - 41 U/L   ALT 11 0 - 44  U/L   Alkaline Phosphatase 100 38 - 126 U/L   Total Bilirubin 1.1 0.0 - 1.2 mg/dL   GFR, Estimated >39 >39 mL/min   Anion gap 11 5 - 15  Urinalysis, Complete w Microscopic -Urine, Clean Catch   Collection Time: 02/01/24  6:16 PM  Result Value Ref Range   Color, Urine YELLOW (A) YELLOW   APPearance HAZY (A) CLEAR   Specific Gravity, Urine 1.019 1.005 - 1.030   pH 6.0 5.0 - 8.0   Glucose, UA NEGATIVE NEGATIVE mg/dL   Hgb urine dipstick NEGATIVE NEGATIVE   Bilirubin Urine NEGATIVE NEGATIVE   Ketones, ur 20 (A) NEGATIVE mg/dL   Protein, ur NEGATIVE NEGATIVE mg/dL   Nitrite NEGATIVE NEGATIVE   Leukocytes,Ua NEGATIVE NEGATIVE   RBC / HPF 6-10 0 - 5 RBC/hpf   WBC, UA 0-5 0 - 5 WBC/hpf   Bacteria, UA RARE (A) NONE SEEN   Squamous Epithelial / HPF 0-5 0 - 5 /HPF   Mucus PRESENT   Urine Drug Screen, Qualitative ([facility] only)   Collection Time: 02/01/24  6:16 PM  Result Value Ref Range   Tricyclic, Ur Screen NONE DETECTED NONE DETECTED   Amphetamines, Ur Screen NONE DETECTED NONE DETECTED   MDMA (Ecstasy)Ur Screen  NONE DETECTED NONE DETECTED   Cocaine Metabolite,Ur [place] NONE DETECTED NONE DETECTED   Opiate, Ur Screen NONE DETECTED NONE DETECTED   Phencyclidine (PCP) Ur S NONE DETECTED NONE DETECTED   Cannabinoid 50 Ng, Ur [place] NONE DETECTED NONE DETECTED   Barbiturates, Ur Screen NONE DETECTED NONE DETECTED   Benzodiazepine, Ur Scrn NONE DETECTED NONE DETECTED   Methadone Scn, Ur NONE DETECTED NONE DETECTED  Type and screen [facility]   Collection Time: 02/01/24  6:16 PM  Result Value Ref Range   ABO/RH(D) B POS    Antibody Screen NEG    Sample Expiration      02/04/2024,2359 Performed at [facility], [address] [place] [place], [place] [address]   ABO/Rh   Collection Time: 02/01/24  6:16 PM  Result Value Ref Range   ABO/RH(D)      B POS Performed at [facility], [address] [place], [place] [address]   Resp panel by RT-PCR (RSV, Flu A&B, Covid) Anterior Nasal Swab   Collection Time: 02/01/24  6:20 PM   Specimen: Anterior Nasal Swab  Result Value Ref Range   SARS Coronavirus 2 by RT PCR POSITIVE (A) NEGATIVE   Influenza A by PCR NEGATIVE NEGATIVE   Influenza B by PCR NEGATIVE NEGATIVE   Resp Syncytial Virus by PCR NEGATIVE NEGATIVE  Chlamydia/NGC rt PCR ([facility] only)   Collection Time: 02/01/24  6:20 PM   Specimen: Cervical/Vaginal swab  Result Value Ref Range   Specimen source GC/Chlam ENDOCERVICAL    Chlamydia Tr NOT DETECTED NOT DETECTED   N gonorrhoeae NOT DETECTED NOT DETECTED  Group B strep by PCR   Collection Time: 02/01/24  6:20 PM   Specimen: Cervical/Vaginal swab; Genital  Result Value Ref Range   Group B strep by PCR PRESUMPTIVE NEGATIVE PRESUMPTIVE NEGATIVE  Wet prep, genital   Collection Time: 02/01/24  6:20 PM   Specimen: Cervical/Vaginal swab  Result Value Ref Range   Yeast Wet Prep HPF POC NONE SEEN NONE SEEN   Trich, Wet Prep NONE SEEN NONE SEEN   Clue Cells Wet Prep HPF POC NONE SEEN NONE SEEN   WBC, Wet Prep HPF POC  <10 <10   Sperm NONE SEEN  Lactic acid, plasma   Collection Time: 02/01/24  6:59 PM  Result Value Ref Range   Lactic Acid, Venous 1.4 0.5 - 1.9 mmol/L    Treatments: IV hydration, analgesia: acetaminophen , Flexeril , and Roxicodone  and Paxlovid   Hospital Course:  This is a 23 y.o. G2P1001 with IUP at [redacted]w[redacted]d admitted for contractions, back pain, cough, nausea/ vomiting, and chills. #Preterm contractions - Monitored by toco #Back pain, nausea/ vomiting, chills, cough - Covid positive - rx's Paxlovid , Robitussin and Cepacol lozenge #Tachycardia - Continuous pulse ox and vitals q4h - CEFM and toco - Cervix: 1/50/-3 (02/01/24)  - refused recheck (02/02/24) - Declined SSE. Patient self collected swabs. - IVF 1 L NS bolus > followed by NS @ 100cc/hr - Labs: CBC, CMP, Mg, lactic acid, UA, Ucx, Bcx, wet prep, GC/CT, UDS, COVID/flu/RSV - TVUS for CL ordered - pt refused transvaginal u/s, so was unable to get cervical length - CXR - EKG x 2 - PO tylenol  1000mg  and Flexeril  10 mg x 1 - IV Zofran  4 mg q6h PRN #ROB - PNV - Rh+ - Varicella IgG, HIV, and RPR ordered - GBS ordered #Anemia #Beta thalassemia minor - Hb 9.7 on 11/19/23 - Has not received any treatment - Repeat CBC ordered and ferritin and if iron deficient, will order IV iron - pt refused  #Fetal tachycardia #Fetal wellbeing - BPP ordered to assess fetal wellbeing - Needs growth ultrasound outpatient #Poor maternal weight gain #Underweight - Pre-pregnancy BMI 17 - TWG 22 lbs - Nutrition consult completed #PPX: SCDs ordered  Dr. Kizzie at [facility] was consulted and said fetal tachycardia was to be expected in light of Covid.  She was deemed stable for discharge to home with outpatient follow up.  Discharge Physical Exam:  BP (!) 111/58   Pulse (!) 116   Temp 100.1 F (37.8 C) (Oral)   Resp 18   Ht 5' 4 (1.626 m)   Wt 57.6 kg   SpO2 99%   BMI 21.80 kg/m   General: NAD CV: RRR Pulm: nl effort ABD: s/nd/nt,  gravid DVT Evaluation: LE non-ttp, no evidence of DVT on exam.  FHT: FHR baseline: 160 bpm Variability: moderate Accelerations: yes Decelerations: none   TOCO: quiet SVE:  Dilation: 1 Effacement (%): 50 Station: -3 Exam by::  (pt refused vaginal exam)   Discharge Condition: Stable  Disposition: Discharge disposition: 01-Home or Self Care        Allergies as of 02/02/2024   No Known Allergies      Medication List     TAKE these medications    cyclobenzaprine  10 MG tablet Commonly known as: FLEXERIL  Take 1 tablet (10 mg total) by mouth at bedtime.   guaiFENesin  100 MG/5ML liquid Commonly known as: ROBITUSSIN Take 5 mLs by mouth every 4 (four) hours as needed for cough or to loosen phlegm.   prenatal multivitamin Tabs tablet Take 1 tablet by mouth daily at 12 noon.        Follow-up Information     [facility]. Call.   Why: to make a follow up appt Contact information: [address] [place]  [address] [phone]                Signed:  DELON MYRON HOWARD 02/02/2024 2:38 PM

## 2024-02-02 NOTE — Progress Notes (Signed)
 RN reviewed pts discharge plan with CNM. When CNM finished, RN remained in the room to address any additional questions. RN reviewed all lab results, x-ray results, EKG results, and US  results with the pt. Pt voiced concerns about back pain and chest pain. RN reiterated that these are common symptoms of COVID and if they worsen to return to the ED. RN emphasized the importance of taking tylenol  and Paxlovid  to address her COVID diagnosis. Pt requested prescription for Robitussin and Flexeril . RN informed CNM of this request. Pt discussed frustrations regarding provider's request to perform cervical exam. RN offered to perform cervical exam, but informed pt that it was not medically necessary. Pt declined.

## 2024-02-03 ENCOUNTER — Ambulatory Visit: Payer: Self-pay | Admitting: Obstetrics and Gynecology

## 2024-02-03 LAB — URINE CULTURE: Culture: >=100000 — AB

## 2024-02-03 LAB — VARICELLA ZOSTER ANTIBODY, IGG: Varicella IgG: NONREACTIVE

## 2024-02-06 LAB — CULTURE, BLOOD (ROUTINE X 2)
Culture: NO GROWTH
Culture: NO GROWTH
Special Requests: ADEQUATE
Special Requests: ADEQUATE

## 2024-02-12 NOTE — Progress Notes (Signed)
 Ms Rost is a 23 y.o. G2P1001 at [redacted]w[redacted]d here for RPV.   She reports small leakage of fluid after drying off from shower. She denies further leakage since. She has been having abdominal and back pain through pregnancy. She denies worsening of this pain following leakage this am. +FM.   General: NAD Heart: regular rate Lungs: regular respiratory effort Abdomen: gravid, nontender GU: NEFG, dry perineum MSK: normal gait Neuro: grossly normal   Patient declined pelvic and requested to self swab for amniotic fluid. Nitrazine and ferning negative.   1. Prenatal care, subsequent pregnancy in third trimester (*)     - up to date on prenatal care; growth US  scheduled for today reshceduled - discussed normal discomforts - reviewed that elective IOL can't be performed until 39w despite patient discomfort and latent labor     2. No leakage of amniotic fluid into vagina  POCT FERN TEST   - evaluation limited but no signs of PROM

## 2024-02-15 ENCOUNTER — Other Ambulatory Visit: Payer: Self-pay

## 2024-02-15 ENCOUNTER — Inpatient Hospital Stay
Admission: EM | Admit: 2024-02-15 | Discharge: 2024-02-16 | DRG: 807 | Disposition: A | Discharge status: Home / Self Care

## 2024-02-15 ENCOUNTER — Encounter: Payer: Self-pay | Admitting: Obstetrics and Gynecology

## 2024-02-15 DIAGNOSIS — Z8249 Family history of ischemic heart disease and other diseases of the circulatory system: Secondary | ICD-10-CM | POA: Diagnosis not present

## 2024-02-15 DIAGNOSIS — Z8616 Personal history of COVID-19: Secondary | ICD-10-CM | POA: Diagnosis not present

## 2024-02-15 DIAGNOSIS — Z148 Genetic carrier of other disease: Secondary | ICD-10-CM | POA: Diagnosis not present

## 2024-02-15 DIAGNOSIS — O99013 Anemia complicating pregnancy, third trimester: Secondary | ICD-10-CM | POA: Diagnosis present

## 2024-02-15 DIAGNOSIS — F419 Anxiety disorder, unspecified: Secondary | ICD-10-CM | POA: Diagnosis present

## 2024-02-15 DIAGNOSIS — O2610 Low weight gain in pregnancy, unspecified trimester: Secondary | ICD-10-CM | POA: Diagnosis present

## 2024-02-15 DIAGNOSIS — R636 Underweight: Secondary | ICD-10-CM | POA: Diagnosis present

## 2024-02-15 DIAGNOSIS — O9081 Anemia of the puerperium: Secondary | ICD-10-CM | POA: Diagnosis not present

## 2024-02-15 DIAGNOSIS — D62 Acute posthemorrhagic anemia: Secondary | ICD-10-CM | POA: Diagnosis not present

## 2024-02-15 DIAGNOSIS — O358XX0 Maternal care for other (suspected) fetal abnormality and damage, not applicable or unspecified: Principal | ICD-10-CM | POA: Diagnosis present

## 2024-02-15 DIAGNOSIS — Z3A37 37 weeks gestation of pregnancy: Secondary | ICD-10-CM

## 2024-02-15 DIAGNOSIS — D563 Thalassemia minor: Secondary | ICD-10-CM | POA: Diagnosis present

## 2024-02-15 DIAGNOSIS — O26893 Other specified pregnancy related conditions, third trimester: Secondary | ICD-10-CM | POA: Diagnosis present

## 2024-02-15 LAB — COMPREHENSIVE METABOLIC PANEL WITH GFR
ALT: 53 U/L — ABNORMAL HIGH (ref 0–44)
AST: 35 U/L (ref 15–41)
Albumin: 2.5 g/dL — ABNORMAL LOW (ref 3.5–5.0)
Alkaline Phosphatase: 86 U/L (ref 38–126)
Anion gap: 7 (ref 5–15)
BUN: 7 mg/dL (ref 6–20)
CO2: 22 mmol/L (ref 22–32)
Calcium: 8.6 mg/dL — ABNORMAL LOW (ref 8.9–10.3)
Chloride: 104 mmol/L (ref 98–111)
Creatinine, Ser: 0.69 mg/dL (ref 0.44–1.00)
GFR, Estimated: >60 mL/min (ref >60)
Glucose, Bld: 97 mg/dL (ref 70–99)
Potassium: 3.8 mmol/L (ref 3.5–5.1)
Sodium: 133 mmol/L — ABNORMAL LOW (ref 135–145)
Total Bilirubin: 0.8 mg/dL (ref 0.0–1.2)
Total Protein: 5.9 g/dL — ABNORMAL LOW (ref 6.5–8.1)

## 2024-02-15 LAB — CBC
HCT: 32.7 % — ABNORMAL LOW (ref 36.0–46.0)
HCT: 25.9 % — ABNORMAL LOW (ref 36.0–46.0)
Hemoglobin: 10 g/dL — ABNORMAL LOW (ref 12.0–15.0)
Hemoglobin: 8.1 g/dL — ABNORMAL LOW (ref 12.0–15.0)
MCH: 22.3 pg — ABNORMAL LOW (ref 26.0–34.0)
MCH: 22.8 pg — ABNORMAL LOW (ref 26.0–34.0)
MCHC: 30.6 g/dL (ref 30.0–36.0)
MCHC: 31.3 g/dL (ref 30.0–36.0)
MCV: 73 fL — ABNORMAL LOW (ref 80.0–100.0)
MCV: 73 fL — ABNORMAL LOW (ref 80.0–100.0)
Platelets: 404 K/uL — ABNORMAL HIGH (ref 150–400)
Platelets: 332 K/uL (ref 150–400)
RBC: 4.48 MIL/uL (ref 3.87–5.11)
RBC: 3.55 MIL/uL — ABNORMAL LOW (ref 3.87–5.11)
RDW: 15.9 % — ABNORMAL HIGH (ref 11.5–15.5)
RDW: 16 % — ABNORMAL HIGH (ref 11.5–15.5)
WBC: 8.2 K/uL (ref 4.0–10.5)
WBC: 13.3 K/uL — ABNORMAL HIGH (ref 4.0–10.5)
nRBC: 0 % (ref 0.0–0.2)
nRBC: 0 % (ref 0.0–0.2)

## 2024-02-15 LAB — TYPE AND SCREEN
ABO/RH(D): B POS
Antibody Screen: NEGATIVE

## 2024-02-15 LAB — MAGNESIUM: Magnesium: 1.8 mg/dL (ref 1.7–2.4)

## 2024-02-15 MED ORDER — FENTANYL CITRATE (PF) 100 MCG/2ML IJ SOLN: 50.0000 ug | INTRAMUSCULAR | Status: DC | PRN

## 2024-02-15 MED ORDER — OXYTOCIN BOLUS FROM INFUSION: 333.0000 mL | Freq: Once | INTRAVENOUS | Status: DC

## 2024-02-15 MED ORDER — OXYCODONE HCL 5 MG PO TABS
5.0000 mg | ORAL_TABLET | Freq: Four times a day (QID) | ORAL | Status: DC | PRN
  Administered 2024-02-15 – 2024-02-16 (×8): 5 mg via ORAL
  Filled 2024-02-15 (×4): qty 1

## 2024-02-15 MED ORDER — OXYTOCIN-SODIUM CHLORIDE 30-0.9 UT/500ML-% IV SOLN: 2.5000 [IU]/h | INTRAVENOUS | Status: DC

## 2024-02-15 MED ORDER — LIDOCAINE HCL (PF) 1 % IJ SOLN: 30.0000 mL | INTRAMUSCULAR | Status: DC | PRN

## 2024-02-15 MED ORDER — ACETAMINOPHEN 500 MG PO TABS
1000.0000 mg | ORAL_TABLET | Freq: Four times a day (QID) | ORAL | Status: DC
  Administered 2024-02-15 – 2024-02-16 (×8): 1000 mg via ORAL
  Filled 2024-02-15 (×5): qty 2

## 2024-02-15 MED ORDER — SODIUM CHLORIDE 0.9% FLUSH: 3.0000 mL | INTRAVENOUS | Status: DC | PRN

## 2024-02-15 MED ORDER — LACTATED RINGERS IV SOLN: INTRAVENOUS | Status: DC

## 2024-02-15 MED ORDER — ZOLPIDEM TARTRATE 5 MG PO TABS: 5.0000 mg | ORAL_TABLET | Freq: Every evening | ORAL | Status: DC | PRN

## 2024-02-15 MED ORDER — SODIUM CHLORIDE 0.9% FLUSH: 3.0000 mL | Freq: Two times a day (BID) | INTRAVENOUS | Status: DC

## 2024-02-15 MED ORDER — VARICELLA VIRUS VACCINE LIVE 1350 PFU/0.5ML IJ SUSR: 0.5000 mL | INTRAMUSCULAR | Status: DC | PRN

## 2024-02-15 MED ORDER — ONDANSETRON HCL 4 MG/2ML IJ SOLN: 4.0000 mg | Freq: Four times a day (QID) | INTRAMUSCULAR | Status: DC | PRN

## 2024-02-15 MED ORDER — IBUPROFEN 600 MG PO TABS
ORAL_TABLET | ORAL | Status: AC
  Administered 2024-02-15 (×2): 600 mg
  Filled 2024-02-15: qty 1

## 2024-02-15 MED ORDER — IBUPROFEN 600 MG PO TABS
600.0000 mg | ORAL_TABLET | Freq: Four times a day (QID) | ORAL | Status: DC
  Administered 2024-02-15 – 2024-02-16 (×8): 600 mg via ORAL
  Filled 2024-02-15 (×5): qty 1

## 2024-02-15 MED ORDER — SENNOSIDES-DOCUSATE SODIUM 8.6-50 MG PO TABS
2.0000 | ORAL_TABLET | Freq: Every day | ORAL | Status: DC
  Administered 2024-02-16 (×2): 2 via ORAL
  Filled 2024-02-15: qty 2

## 2024-02-15 MED ORDER — BENZOCAINE-MENTHOL 20-0.5 % EX AERO
1.0000 | INHALATION_SPRAY | CUTANEOUS | Status: DC | PRN
  Filled 2024-02-15: qty 56

## 2024-02-15 MED ORDER — DIBUCAINE (PERIANAL) 1 % EX OINT: 1.0000 | TOPICAL_OINTMENT | CUTANEOUS | Status: DC | PRN

## 2024-02-15 MED ORDER — SIMETHICONE 80 MG PO CHEW: 80.0000 mg | CHEWABLE_TABLET | ORAL | Status: DC | PRN

## 2024-02-15 MED ORDER — WITCH HAZEL-GLYCERIN EX PADS
1.0000 | MEDICATED_PAD | CUTANEOUS | Status: DC | PRN
  Filled 2024-02-15: qty 100

## 2024-02-15 MED ORDER — SODIUM CHLORIDE 0.9 % IV SOLN: 250.0000 mL | INTRAVENOUS | Status: DC | PRN

## 2024-02-15 MED ORDER — LACTATED RINGERS IV SOLN: 500.0000 mL | INTRAVENOUS | Status: DC | PRN

## 2024-02-15 MED ORDER — DIPHENHYDRAMINE HCL 25 MG PO CAPS: 25.0000 mg | ORAL_CAPSULE | Freq: Four times a day (QID) | ORAL | Status: DC | PRN

## 2024-02-15 MED ORDER — SOD CITRATE-CITRIC ACID 500-334 MG/5ML PO SOLN: 30.0000 mL | ORAL | Status: DC | PRN

## 2024-02-15 MED ORDER — ONDANSETRON HCL 4 MG/2ML IJ SOLN: 4.0000 mg | INTRAMUSCULAR | Status: DC | PRN

## 2024-02-15 MED ORDER — ONDANSETRON HCL 4 MG PO TABS: 4.0000 mg | ORAL_TABLET | ORAL | Status: DC | PRN

## 2024-02-15 MED ORDER — PRENATAL MULTIVITAMIN CH
1.0000 | ORAL_TABLET | Freq: Every day | ORAL | Status: DC
  Administered 2024-02-15 – 2024-02-16 (×4): 1 via ORAL
  Filled 2024-02-15 (×2): qty 1

## 2024-02-15 MED ORDER — COCONUT OIL OIL
1.0000 | TOPICAL_OIL | Status: DC | PRN
  Filled 2024-02-15 (×2): qty 7.5

## 2024-02-15 MED ORDER — ACETAMINOPHEN 500 MG PO TABS
1000.0000 mg | ORAL_TABLET | Freq: Four times a day (QID) | ORAL | Status: DC | PRN
  Administered 2024-02-15 (×2): 1000 mg via ORAL
  Filled 2024-02-15: qty 2

## 2024-02-15 MED ORDER — FERROUS SULFATE 325 (65 FE) MG PO TABS
325.0000 mg | ORAL_TABLET | Freq: Two times a day (BID) | ORAL | Status: DC
  Administered 2024-02-15 – 2024-02-16 (×4): 325 mg via ORAL
  Filled 2024-02-15 (×2): qty 1

## 2024-02-15 NOTE — Discharge Summary (Signed)
 Postpartum Discharge Summary  Patient Name: Kara Nolan DOB: [birth year] MRN: [id number]  Date of admission: 02/15/2024 Delivery date:02/15/2024 Delivering provider: MACKIE, ANNA M Date of discharge: 02/16/2024  Primary OB: [facility]  LMP:No LMP recorded (lmp unknown). Patient is pregnant. EDC Estimated Date of Delivery: 03/01/24 Gestational Age at Delivery: [redacted]w[redacted]d   Admitting diagnosis: Normal labor [O80, Z37.9] Intrauterine pregnancy: [redacted]w[redacted]d     Secondary diagnosis:   Principal Problem:   NSVD (normal spontaneous vaginal delivery) Active Problems:   Anemia affecting pregnancy in third trimester   Low maternal weight gain   Beta thalassemia minor   Normal labor   Underweight   Anxiety during pregnancy  Discharge Diagnosis: Term Pregnancy Delivered      Hospital course: Onset of Labor With Vaginal Delivery      23 y.o. yo G2P1001 at [redacted]w[redacted]d was admitted in Active Labor on 02/15/2024. Labor course was uncomplicated.  Kara Nolan presented to L&D with active labor and immanent delivery. She arrived via EMS yelling I'm pushing. She was quickly moved to a labor bed and found to be C/C/+2. AROM performed immediately before delivery. She pushed for a spontaneous vaginal birth.  Membrane Rupture Time/Date: 8:33 AM,02/15/2024  Delivery Method:Vaginal, Spontaneous Operative Delivery:N/A Episiotomy: None Lacerations:  None Patient had a postpartum course complicated by severe anemia - pt was asymptomatic but offered IV iron prior to discharge, which she refused.  She preferred proceeding with PO iron at home  She is ambulating, tolerating a regular diet, passing flatus, and urinating well. Patient is discharged home in stable condition on 02/16/24.  Newborn Data: Birth date:02/15/2024 Birth time:8:35 AM Gender:Female Living status:Living Apgars:9 ,9  Weight:2890 g                                            Post partum procedures:none Augmentation:: N/A Complications:  None Delivery Type: spontaneous vaginal delivery Anesthesia: None Placenta: spontaneous To Pathology: No   Prenatal Labs:  Blood type/Rh Conflict (See Lab Report): B POS/B POS Performed at [facility], [address] [place], [place] [address]    Antibody screen Negative    Rubella Immune    Varicella Not immune  RPR NR    HBsAg Neg   Hep C NR   HIV NON REACTIVE (07/29 1812)   GC neg  Chlamydia neg  Genetic screening cfDNA negative   1 hour GTT Unknown  3 hour GTT Unknown  GBS PRESUMPTIVE NEGATIVE/-- (07/29 1820)     Magnesium Sulfate received: No BMZ received: No Rhophylac:was not indicated MMR: was not indicated Varivax vaccine given: was offered and declined by the patient Tdap vaccine: declined  Flu vaccine: due in season RSV vaccine: not in season   Transfusion:No  Physical exam  Vitals:   02/15/24 1800 02/15/24 1928 02/15/24 2333 02/16/24 0751  BP: (!) 110/58 111/65 107/69 107/67  Pulse: 78 68 70 73  Resp: 18 18 18 18   Temp: 98.4 F (36.9 C) 97.7 F (36.5 C) 98.6 F (37 C) 98.1 F (36.7 C)  TempSrc:  Oral Oral Oral  SpO2: 99% 100% 98% 100%  Weight:      Height:       General: alert, cooperative, and no distress Lochia: appropriate Uterine Fundus: firm Perineum: minimal edema/intact DVT Evaluation: No evidence of DVT seen on physical exam.  Labs: Lab Results  Component Value Date  WBC 11.9 (H) 02/16/2024   HGB 7.6 (L) 02/16/2024   HCT 24.3 (L) 02/16/2024   MCV 73.6 (L) 02/16/2024   PLT 326 02/16/2024      Latest Ref Rng & Units 02/15/2024    7:20 PM  CMP  Glucose 70 - 99 mg/dL 97   BUN 6 - 20 mg/dL 7   Creatinine 9.55 - 8.99 mg/dL 9.30   Sodium 864 - 854 mmol/L 133   Potassium 3.5 - 5.1 mmol/L 3.8   Chloride 98 - 111 mmol/L 104   CO2 22 - 32 mmol/L 22   Calcium  8.9 - 10.3 mg/dL 8.6   Total Protein 6.5 - 8.1 g/dL 5.9   Total Bilirubin 0.0 - 1.2 mg/dL 0.8   Alkaline Phos 38 - 126 U/L 86   AST 15 - 41 U/L 35   ALT 0 - 44  U/L 53    Edinburgh Score:    02/15/2024   12:34 PM  Edinburgh Postnatal Depression Scale Screening Tool  I have been able to laugh and see the funny side of things. 3  I have looked forward with enjoyment to things. 3  I have blamed myself unnecessarily when things went wrong. 0  I have been anxious or worried for no good reason. 3  I have felt scared or panicky for no good reason. 0  Things have been getting on top of me. 0  I have been so unhappy that I have had difficulty sleeping. 0  I have felt sad or miserable. 0  I have been so unhappy that I have been crying. 0  The thought of harming myself has occurred to me. 0  Edinburgh Postnatal Depression Scale Total 9     Postpartum VTE Prophylaxis  Recommend 6 weeks of prophylactic anticoagulation with LMWH or subcutaneous unfractionated heparin if 1 or more high risk factor is present.  Recommend 14 days of prophylactic anticoagulation with LMWH or subcutaneous unfractionated heparin if 3 or more moderate risk factors are present.   Risk assessment for postpartum VTE and prophylactic treatment: High risk factors: None Moderate risk factors: None  Postpartum VTE prophylaxis with LMWH not indicated    After visit meds:  Allergies as of 02/16/2024   No Known Allergies      Medication List     STOP taking these medications    cyclobenzaprine  10 MG tablet Commonly known as: FLEXERIL    guaiFENesin  100 MG/5ML liquid Commonly known as: ROBITUSSIN       TAKE these medications    acetaminophen  500 MG tablet Commonly known as: TYLENOL  Take 1 tablet (500 mg total) by mouth every 6 (six) hours as needed.   ferrous sulfate  325 (65 FE) MG tablet Take 1 tablet (325 mg total) by mouth 2 (two) times daily with a meal.   ibuprofen  600 MG tablet Commonly known as: ADVIL  Take 1 tablet (600 mg total) by mouth every 6 (six) hours as needed.   prenatal multivitamin Tabs tablet Take 1 tablet by mouth daily at 12 noon.        Discharge home in stable condition Infant Feeding: Breast Infant Disposition:home with mother Discharge instruction: per After Visit Summary and Postpartum booklet. Activity: Advance as tolerated. Pelvic rest for 6 weeks.  Diet: routine diet Anticipated Birth Control:  Contraceptives: TBD Postpartum Appointment:6 weeks Additional Postpartum F/U: Postpartum Depression checkup in 2 weeks  Future Appointments:No future appointments. Follow up Visit:  Follow-up Information     [facility]. Schedule an appointment  as soon as possible for a visit in 2 week(s).   Why: postpartum mood check                Plan:  Kara Nolan was discharged to home in good condition. Follow-up appointment as directed.    Signed: Jenifer E Claudetta Sallie  02/16/2024 11:06 AM

## 2024-02-15 NOTE — Discharge Instructions (Signed)
 Vaginal Delivery, Care After Refer to this sheet in the next few weeks. These discharge instructions provide you with information on caring for yourself after delivery. Your caregiver may also give you specific instructions. Your treatment has been planned according to the most current medical practices available, but problems sometimes occur. Call your caregiver if you have any problems or questions after you go home. HOME CARE INSTRUCTIONS Take over-the-counter or prescription medicines only as directed by your caregiver or pharmacist. Do not drink alcohol, especially if you are breastfeeding or taking medicine to relieve pain. Do not smoke tobacco. Continue to use good perineal care. Good perineal care includes: Wiping your perineum from front to back Keeping your perineum clean. You can do sitz baths twice a day, to help keep this area clean Do not use tampons, douche or have sex until your caregiver says it is okay. Shower only and avoid sitting in submerged water, aside from sitz baths Wear a well-fitting bra that provides breast support. Eat healthy foods. Drink enough fluids to keep your urine clear or pale yellow. Eat high-fiber foods such as whole grain cereals and breads, brown rice, beans, and fresh fruits and vegetables every day. These foods may help prevent or relieve constipation. Avoid constipation with high fiber foods or medications, such as miralax or metamucil Follow your caregiver's recommendations regarding resumption of activities such as climbing stairs, driving, lifting, exercising, or traveling. Talk to your caregiver about resuming sexual activities. Resumption of sexual activities is dependent upon your risk of infection, your rate of healing, and your comfort and desire to resume sexual activity. Try to have someone help you with your household activities and your newborn for at least a few days after you leave the hospital. Rest as much as possible. Try to rest or  take a nap when your newborn is sleeping. Increase your activities gradually. Keep all of your scheduled postpartum appointments. It is very important to keep your scheduled follow-up appointments. At these appointments, your caregiver will be checking to make sure that you are healing physically and emotionally. SEEK MEDICAL CARE IF:  You are passing large clots from your vagina. Save any clots to show your caregiver. You have a foul smelling discharge from your vagina. You have trouble urinating. You are urinating frequently. You have pain when you urinate. You have a change in your bowel movements. You have increasing redness, pain, or swelling near your vaginal incision (episiotomy) or vaginal tear. You have pus draining from your episiotomy or vaginal tear. Your episiotomy or vaginal tear is separating. You have painful, hard, or reddened breasts. You have a severe headache. You have blurred vision or see spots. You feel sad or depressed. You have thoughts of hurting yourself or your newborn. You have questions about your care, the care of your newborn, or medicines. You are dizzy or light-headed. You have a rash. You have nausea or vomiting. You were breastfeeding and have not had a menstrual period within 12 weeks after you stopped breastfeeding. You are not breastfeeding and have not had a menstrual period by the 12th week after delivery. You have a fever. SEEK IMMEDIATE MEDICAL CARE IF:  You have persistent pain. You have chest pain. You have shortness of breath. You faint. You have leg pain. You have stomach pain. Your vaginal bleeding saturates two or more sanitary pads in 1 hour. MAKE SURE YOU:  Understand these instructions. Will watch your condition. Will get help right away if you are not doing well or  get worse. Document Released: 06/19/2000 Document Revised: 11/06/2013 Document Reviewed: 02/17/2012 [facility] Patient Information 2015 [place], [place]. This  information is not intended to replace advice given to you by your health care provider. Make sure you discuss any questions you have with your health care provider.  Sitz Bath A sitz bath is a warm water bath taken in the sitting position. The water covers only the hips and butt (buttocks). We recommend using one that fits in the toilet, to help with ease of use and cleanliness. It may be used for either healing or cleaning purposes. Sitz baths are also used to relieve pain, itching, or muscle tightening (spasms). The water may contain medicine. Moist heat will help you heal and relax.  HOME CARE  Take 3 to 4 sitz baths a day. Fill the bathtub half-full with warm water. Sit in the water and open the drain a little. Turn on the warm water to keep the tub half-full. Keep the water running constantly. Soak in the water for 15 to 20 minutes. After the sitz bath, pat the affected area dry. GET HELP RIGHT AWAY IF: You get worse instead of better. Stop the sitz baths if you get worse. MAKE SURE YOU: Understand these instructions. Will watch your condition. Will get help right away if you are not doing well or get worse. Document Released: 07/30/2004 Document Revised: 03/16/2012 Document Reviewed: 10/20/2010 [facility] Patient Information 2015 [place], [place]. This information is not intended to replace advice given to you by your health care provider. Make sure you discuss any questions you have with your health care provider.

## 2024-02-15 NOTE — H&P (Signed)
 [facility]  Triage History and Physical  Assessment  Active Hospital Problems   [redacted] weeks gestation of pregnancy (*)    Alteration in comfort associated with uterine contractions    NST (non-stress test) reactive (*)    Limited prenatal care   Plan  -Stable for discharge to home -Active labor and rupture membranes precautions discussed -Encourage follow-up with OB/GYN to consider elective induction of labor at 39 weeks due to discomfort of the pregnancy. -Recommend Tylenol , rest, warm compresses to aid in discomfort associated with contractions. -GBS collected and results pending, will f/u via MyChart.  History  Kara Nolan is a 23 y.o. female G2P1001 at [redacted]w[redacted]d weeks with LMP   with estimated due date of 03/01/2024, by Other Basis who presents with contractions. History of Present Illness Patient reports that she started contracting overnight and has been contracting every 5 minutes.  She reports she had to wait for transportation and during that time of waiting her contractions started to space and while in triage they have spaced.  Patient denies any abnormal vaginal discharge or foul-smelling odors.  She reports good fetal movement and denies any vaginal bleeding or rupture of membranes.  The patient frustrated with her irregular contractions and no cervical change since her last check at [facility] on 02/01/2024.  Patient reports that she was told she was 2 cm but cervical exam and no reports 1/50/-3.  Patient very tearful and anxious with cervical exam.    Planned Tubal Ligation:      No OB History  Gravida Para Term Preterm AB Living  2 1 1  0 0 1  SAB IAB Ectopic Multiple Live Births  0 0 0 0 1    # Outcome Date GA Lbr Len/2nd Weight Sex Type Anes PTL Lv  2 Current           1 Term 01/26/23 [redacted]w[redacted]d 12:41 / 02:39 3.18 kg (7 lb 0.2 oz) F Vag-Spont EPI N LIV     Complications: Birthing Parent Fever >100.4     Name: SLADE NORVEL RUEBEN      Apgar1: 8  Apgar5: 9   Past Medical History:  Diagnosis Date  . Anemia    Past Surgical History:  Procedure Laterality Date  . Wisdom tooth extraction      Allergies[1] Prior to Admission medications  Medication Sig Start Date End Date Taking? Authorizing Provider  acetaminophen  (TYLENOL ) 500 mg tablet Take two tablets (1,000 mg dose) by mouth as needed for Pain (Pt reports taking 4 tablets over the corse of last night). 11/22/23  Yes Maude GORMAN Lord, MD  cyclobenzaprine  (FLEXERIL ) 10 mg tablet Take one tablet (10 mg dose) by mouth 3 (three) times a day as needed for Muscle spasms for up to 10 days. 02/11/24 02/21/24 Yes Rocky CHRISTELLA Duncan, MD  omeprazole (PRILOSEC) 20 mg capsule Take one capsule (20 mg dose) by mouth daily as needed (heartburn) for up to 30 days. 11/22/23 02/11/24  Hendrick Curie, DO  ondansetron  (ZOFRAN -ODT) 4 mg disintegrating tablet Take one tablet (4 mg dose) by mouth every 8 (eight) hours as needed for Nausea. 12/07/23  Yes Maude GORMAN Lord, MD  prochlorperazine (COMPAZINE) 10 MG tablet Take one tablet (10 mg dose) by mouth every 6 (six) hours as needed. 11/22/23 02/11/24  Maude GORMAN Lord, MD   Family History  Problem Relation Age of Onset  . Hypertension Father   . Breast cancer Paternal Grandmother   . Ovarian cancer Neg Hx   .  Colon cancer Neg Hx    Social History[2] Review of Systems   Pertinent review of systems noted in HPI otherwise negative   Physical Examination  Heart Rate:  [88-98] 98 BP: (107-111)/(53-69) 111/69 Pain Score: 10-Worst pain ever    Latest cervical exam by OB Examiner: Dr. Manuela (02/15/24 0500) Dilation: 1       Cx Position: Posterior Presentation: Vertex    Physical Exam Vitals reviewed. Exam conducted with a chaperone present Edwardo, RN).  Constitutional:      Appearance: Normal appearance.  HENT:     Nose: Nose normal.     Mouth/Throat:     Pharynx: Oropharynx is clear.  Eyes:     Conjunctiva/sclera: Conjunctivae normal.   Cardiovascular:     Rate and Rhythm: Normal rate.  Pulmonary:     Effort: Pulmonary effort is normal. No respiratory distress.  Abdominal:     General: There is no distension.     Palpations: Abdomen is soft.     Tenderness: There is no abdominal tenderness.  Genitourinary:    General: Normal vulva.     Exam position: Lithotomy position.     Labia:        Right: No rash, tenderness, lesion or injury.        Left: No rash, tenderness, lesion or injury.      Comments: Patient very intolerant to cervical exam.  SVE 1/50/-3. Skin:    General: Skin is warm and dry.  Neurological:     General: No focal deficit present.     Mental Status: She is alert and oriented to person, place, and time.  Psychiatric:        Mood and Affect: Mood normal.     Comments: frustrated    Fetal monitoring:   Fetal Heart Rate Mode: External US ;Applied (02/15/24 0405 : Rosina Pear, RN, BSN) Baseline Rate: 130 bpm (02/15/24 0430 : Rosina Pear, RN, BSN) Variability: Moderate 6-25bpm (02/15/24 0430 : Rosina Pear, RN, BSN) Accelerations: Present (02/15/24 0430 : Rosina Pear, RN, BSN) Decelerations: Variable (02/15/24 0430 : Rosina Pear, RN, BSN)     Uterine activity: Mode: External;TOCO Applied (02/15/24 0405 : Rosina Pear, RN, BSN)  Irregular contractions every 3 to 7 minutes.  Results  Prenatal Labs: ABO Grouping (no units)  Date Value  07/27/2023 B   Rh Factor (no units)  Date Value  07/27/2023 Positive   Antibody Screen (no units)  Date Value  07/27/2023 Negative  01/26/2023 NEG        Hemoglobin (g/dL)  Date Value  94/83/7974 9.7 (L)  07/27/2023 10.8 (L)  07/14/2023 10.7 (L)   HGB (gm/dL)  Date Value  87/77/7975 10.7 (L)  01/26/2023 10.2 (L)  01/11/2023 9.6 (L)   Hematocrit (%)  Date Value  11/19/2023 32.4 (L)  07/27/2023 35.5  07/14/2023 33.4 (L)   HCT (%)  Date Value  06/27/2023 32.9 (L)  01/26/2023 32.7 (L)  01/11/2023 30.0 (L)   Protein (no units)   Date Value  10/23/2022 Negative   Urine Protein - Dipstick (mg/dl)  Date Value  87/77/7975 10 (A)        Strep Gp B NAA (no units)  Date Value  12/25/2022 Negative          GONOCOCCUS BY NAA (no units)  Date Value  07/27/2023 Negative  07/16/2022 Negative   CHLAMYDIA BY NAA (no units)  Date Value  07/27/2023 Negative  07/16/2022 Negative   RPR (no units)  Date Value  11/19/2023 Non Reactive  07/27/2023 Non Reactive  11/02/2022 Non Reactive   RPR QUALITATIVE (no units)  Date Value  01/26/2023 Non-Reactive        Rubella Ab, IgG (index)  Date Value  07/27/2023 1.71     Hepatitis B Surface Ag (no units)  Date Value  07/27/2023 Negative   HIV Ab/p24 Ag Screen (no units)  Date Value  07/27/2023 Non Reactive    No results found for: PPD No results found for: TSH No results found for: HMPAP      No results found for: CF No results found for: CANAVAN No components found for: BETATHALASSEMIAHB  No results found for: SICKLE  No results found for: TAYSACHS     (Electronically Signed:)Eboni Ogunbekun, DO 02/15/2024 5:09 AM      [1] No Known Allergies [2] Social History Socioeconomic History  . Marital status: Married    Spouse name: Rome Moats  Tobacco Use  . Smoking status: Never    Passive exposure: Never  . Smokeless tobacco: Never  Vaping Use  . Vaping status: Never Used  Substance and Sexual Activity  . Alcohol use: Never  . Drug use: Never  . Sexual activity: Yes    Partners: Male  Social History Narrative   ** Merged History Encounter **

## 2024-02-15 NOTE — Progress Notes (Signed)
 Patient declined EKG at this time, requested to come back at a later time.

## 2024-02-15 NOTE — Progress Notes (Signed)
 Pt complaining of a heavy feeling on chest when moving around the room. She says it still is present when resting but not as heavy. Discussed w/ Therisa HERO. CNM.

## 2024-02-15 NOTE — H&P (Signed)
 OB History & Physical   History of Present Illness:   Chief Complaint: contractions   HPI:  Kara Nolan is a 23 y.o. G2P1001 female at [redacted]w[redacted]d, No LMP recorded (lmp unknown). Patient is pregnant., not consistent with US  at [redacted]w[redacted]d, with Estimated Date of Delivery: 03/01/24.  She presents to L&D for active labor and immanent delivery.  Her pregnancy is complicated by limited prenatal care, underweight, anemia, beta thalassemia minor and recent Covid-19 infection (02/01/24) .  She was evaluated at [facility] in [place] last night and was discharged early this morning after no cervical change. She denies Loss of fluid or Vaginal bleeding. Endorses fetal movement as active. Arrived via EMS yelling I'm pushing.   Reports active fetal movement  Contractions: strong contractions every 3-5 minutes that have been ongoing since last evening  LOF/SROM: denies  Vaginal bleeding: denies   Factors complicating pregnancy:  Principal Problem:   Normal labor Active Problems:   Anemia affecting pregnancy in third trimester   Low maternal weight gain   Beta thalassemia minor   Underweight   Anxiety during pregnancy    Prenatal care site:  [facility]   Patient Active Problem List   Diagnosis Date Noted   Normal labor 02/15/2024   Anxiety during pregnancy 02/15/2024   COVID-19 affecting pregnancy in third trimester 02/02/2024   Tachycardia 02/01/2024   Fetal tachycardia affecting management of mother 02/01/2024   Low maternal weight gain 02/01/2024   Beta thalassemia minor 02/01/2024   Anemia affecting pregnancy in third trimester 12/30/2023   Supervision of other normal pregnancy, antepartum 10/18/2023   Underweight 07/17/2022   Seasonal allergies 04/17/2014   Anxiety state 04/06/2013   Anxiety as acute reaction to exceptional stress 10/08/2010    Prenatal Transfer Tool  Maternal Diabetes: Unknown  Genetic Screening: Normal Maternal Ultrasounds/Referrals:  Fetal renal pyelectasis Fetal Ultrasounds or other Referrals:  Referred to Materal Fetal Medicine  Maternal Substance Abuse:  No Significant Maternal Medications:  None Significant Maternal Lab Results: Group B Strep negative  Maternal Medical History:   Past Medical History:  Diagnosis Date   Anxiety    Depression     Past Surgical History:  Procedure Laterality Date   MULTIPLE TOOTH EXTRACTIONS     WISDOM TOOTH EXTRACTION      No Known Allergies  Prior to Admission medications   Medication Sig Start Date End Date Taking? Authorizing Provider  cyclobenzaprine  (FLEXERIL ) 10 MG tablet Take 1 tablet (10 mg total) by mouth at bedtime. 02/02/24   Myron Nest, CNM  guaiFENesin  (ROBITUSSIN) 100 MG/5ML liquid Take 5 mLs by mouth every 4 (four) hours as needed for cough or to loosen phlegm. 02/02/24   Myron Nest, CNM  Prenatal Vit-Fe Fumarate-FA (PRENATAL MULTIVITAMIN) TABS tablet Take 1 tablet by mouth daily at 12 noon.    [provider]    OB History  Gravida Para Term Preterm AB Living  2 1 1  0 0 1  SAB IAB Ectopic Multiple Live Births  0 0 0 0 1    # Outcome Date GA Lbr Len/2nd Weight Sex Type Anes PTL Lv  2 Current           1 Term 01/26/23 [redacted]w[redacted]d 12:41 / 02:39 3180 g F Vag-Spont EPI N LIV     Name: ROLANDO, WHITBY     Apgar1: 8  Apgar5: 9     Social History: She  reports that she has never smoked. She does not have any smokeless tobacco  history on file. She reports that she does not drink alcohol and does not use drugs.  Family History: family history includes Heart disease in her paternal grandmother; Hypertension in her paternal grandmother.   Review of Systems: A full review of systems was performed and negative except as noted in the HPI.     Physical Exam:  Vital Signs: BP 104/76 (BP Location: Left Arm)   Pulse 70   Temp 98.3 F (36.8 C) (Oral)   Resp 16   Ht 5' 4 (1.626 m)   Wt 65.8 kg   LMP  (LMP Unknown)   BMI 24.89 kg/m    General: acute distress r/t active labor and immanent delivery on arrival to L&D  HEENT: normocephalic, atraumatic Heart: regular rate & rhythm Lungs: normal respiratory effort Abdomen: soft, gravid, non-tender;  EFW: 6lbs Pelvic:   External: Normal external female genitalia  Cervix: 10/100/+2   Extremities: non-tender, symmetric, no edema bilaterally.  DTRs: 2+/2+  Neurologic: Alert & oriented x 3.    No results found for this or any previous visit (from the past 24 hours).  Pertinent Results:  Prenatal Labs: Blood type/Rh Conflict (See Lab Report): B POS/B POS Performed at [facility], [address] [place], [place] [address]    Antibody screen Negative    Rubella Immune    Varicella Not immune  RPR NR    HBsAg Neg   Hep C NR   HIV NON REACTIVE (07/29 1812)   GC neg  Chlamydia neg  Genetic screening cfDNA negative   1 hour GTT Unknown  3 hour GTT Unknown  GBS PRESUMPTIVE NEGATIVE/-- (07/29 1820)    FHT:  FHR: 145 bpm, variability: moderate,  accelerations:  Abscent,  decelerations:  Absent Category/reactivity:  Category I UC:   regular, every 3-5 minutes   Cephalic by visualization of fetal head   US  FETAL BPP WO NON STRESS Result Date: 02/01/2024 CLINICAL DATA:  Antepartum fetal tachycardia. Low maternal weight gain. Pre term contractions. Assigned gestational age is 35 weeks 6 days. EXAM: LIMITED OBSTETRIC ULTRASOUND AND BIOPHYSICAL PROFILE COMPARISON:  01/17/2024 FINDINGS: Number of Fetuses: 1 Heart Rate:  168-175 bpm Movement: Fetal movement is observed. Presentation: Cephalic presentation. Placental Location: Fundal placenta. Previa: No previa. Amniotic Fluid (Subjective):  Subjectively normal. AFI: 9.6 cm.  Reference normal for 35 weeks is 7.9-24.9 cm BPD:  8.1cm 32w 3d FL: 6.7 cm, 34 weeks 3 days MATERNAL FINDINGS: Cervix: Unable to visualize cervix. Patient refused transvaginal imaging. Uterus/Adnexae: No abnormality visualized. Movement:  2  Time:  10 minutes Breathing: 2 Tone:  2 Amniotic Fluid: 2 Total Score:  8/8 IMPRESSION: 1. Single intrauterine pregnancy. Fetus is in cephalic presentation. Limited fetal measurements are obtained and these are small for reported assigned gestational age. Clinical correlation recommended. 2. Normal biophysical profile score of 8/8. This exam is performed on an emergent basis and does not comprehensively evaluate fetal size, dating, or anatomy; follow-up complete OB US  should be considered if further fetal assessment is warranted. Electronically Signed   By: Elsie Gravely M.D.   On: 02/01/2024 20:35   US  OB Limited Result Date: 02/01/2024 CLINICAL DATA:  Antepartum fetal tachycardia. Low maternal weight gain. Pre term contractions. Assigned gestational age is 35 weeks 6 days. EXAM: LIMITED OBSTETRIC ULTRASOUND AND BIOPHYSICAL PROFILE COMPARISON:  01/17/2024 FINDINGS: Number of Fetuses: 1 Heart Rate:  168-175 bpm Movement: Fetal movement is observed. Presentation: Cephalic presentation. Placental Location: Fundal placenta. Previa: No previa. Amniotic Fluid (Subjective):  Subjectively normal.  AFI: 9.6 cm.  Reference normal for 35 weeks is 7.9-24.9 cm BPD:  8.1cm 32w 3d FL: 6.7 cm, 34 weeks 3 days MATERNAL FINDINGS: Cervix: Unable to visualize cervix. Patient refused transvaginal imaging. Uterus/Adnexae: No abnormality visualized. Movement:  2  Time: 10 minutes Breathing: 2 Tone:  2 Amniotic Fluid: 2 Total Score:  8/8 IMPRESSION: 1. Single intrauterine pregnancy. Fetus is in cephalic presentation. Limited fetal measurements are obtained and these are small for reported assigned gestational age. Clinical correlation recommended. 2. Normal biophysical profile score of 8/8. This exam is performed on an emergent basis and does not comprehensively evaluate fetal size, dating, or anatomy; follow-up complete OB US  should be considered if further fetal assessment is warranted. Electronically Signed   By: Elsie Gravely M.D.    On: 02/01/2024 20:35   DG Chest Port 1 View Result Date: 02/01/2024 CLINICAL DATA:  Cough. EXAM: PORTABLE CHEST 1 VIEW COMPARISON:  Chest radiograph dated 07/06/2017. FINDINGS: The heart size and mediastinal contours are within normal limits. Both lungs are clear. The visualized skeletal structures are unremarkable. IMPRESSION: No active disease. Electronically Signed   By: Vanetta Chou M.D.   On: 02/01/2024 18:47   US  OB Limited Result Date: 01/17/2024 CLINICAL DATA:  [id number] Abdominal pain affecting pregnancy, antepartum [id number] EXAM: LIMITED OBSTETRIC ULTRASOUND COMPARISON:  None Available. FINDINGS: Number of Fetuses: 1 Heart Rate:  150 bpm Movement: Present Presentation: Cephalic Placental Location: In the fundus extending posteriorly Previa: No Amniotic Fluid (Subjective):  Within normal limits. AFI: 13.2 cm BPD: 8.1 cm 32 w  3 d MATERNAL FINDINGS: Cervix:  Closed.  Measures 4 cm. Uterus/Adnexae: No abnormality visualized. IMPRESSION: Single live intrauterine gestation in cephalic presentation with an average ultrasound age of 32 weeks, 3 days. Fetal heart rate of 150 beats per minute. Electronically Signed   By: Rogelia Myers M.D.   On: 01/17/2024 12:45    Assessment:  BRIANNY SOULLIERE is a 23 y.o. G2P1001 female at [redacted]w[redacted]d with active labor.   Plan:  1. Admit to Labor & Delivery - Admission status: Inpatient - Dr IVAR Dinsmore MD notified of admission and plan of care  - Reason for admission: labor management - consents reviewed and obtained  2. Fetal Well being  - Fetal Tracing: Cat 1 - Group B Streptococcus ppx not indicated: GBS negative - Presentation: cephalic confirmed by visualization of fetal head    3. Routine OB: - Prenatal labs reviewed, as above - Rh positive - CBC, T&S, RPR on admit - Clear liquid diet , saline lock  4. Monitoring of labor  - Contractions monitored by palpation  - Pelvis proven to 3180 grams adequate for trial of labor  - Plan for  expectant management  - Plan for  intermittent fetal monitoring  - Anticipate vaginal delivery - Delivery room prepared for immanent delivery   5. Post Partum Planning: - Infant feeding: breast feeding - Contraception: Undecided  - Flu vaccine: due in season - Tdap vaccine: Not given - RSV vaccine: not in season   Kara Nolan, [place] 02/15/24 9:18 AM  Kara Nolan, CNM Certified Nurse Midwife [place]  [facility]

## 2024-02-15 NOTE — Lactation Note (Signed)
 This note was copied from a baby's chart. Lactation Consultation Note  Patient Name: Kara Nolan Date: 02/15/2024 Age:22 hours Reason for consult: Initial assessment;Early term 37-38.6wks   Maternal Data Initial assessment w/ a P2 patient and a 8hr old baby Kara.  This was a SVD.  Patient w/ hx of anxiety.  Patient expressed that she needs help latching infant because it hurts when she is on the breast.  She really wants to nurse but is having a hard time.  Feeding Mother's Current Feeding Choice: Breast Milk  LC demonstrated to MOB how to arouse infant for a feeding.  Patient has flat nipples that erect w/ stimulation and go inward slightly.  Infant was placed at the breast.  Displays wide open gape but converts to a shallow latch. Several attempts were made to get infant to sustain latch and continuously feed at the breast. After several attempts, LC attempted w/ a NS but infant would not latch.    Infant placed STS and for patient to attempt a feeding at the breast in 20 minutes.  Patient verbalized understanding.   LATCH Score Latch: Repeated attempts needed to sustain latch, nipple held in mouth throughout feeding, stimulation needed to elicit sucking reflex.  Audible Swallowing: None  Type of Nipple: Flat  Comfort (Breast/Nipple): Soft / non-tender  Hold (Positioning): Assistance needed to correctly position infant at breast and maintain latch.  LATCH Score: 5   Lactation Tools Discussed/Used Provided a Medela Manual pump w/ a 18/56mm flange.    Interventions Interventions: Breast feeding basics reviewed;Assisted with latch;Skin to skin;Hand express;Breast compression;Adjust position;Support pillows;Position options;Hand pump;Education  LC provided education on the following;  milk production expectations, hunger cues, day 1/2 wet/dirty diapers, hand expression, cluster feeding, benefits of STS and arousing infant for a feeding.  Lactation informed  patient of feeding infant at least 8 or more times w/in a 24hr period but not exceeding 3hrs. Patient verbalized understanding.   Patient verbalized that she really wants to breastfeed.  LC set patient up with a plan through the night if infant struggles to latch.  Parents verbalized understanding.    Consult Status Consult Status: Follow-up Follow-up type: In-patient    Vivica Dobosz S Keon Benscoter 02/15/2024, 6:36 PM

## 2024-02-16 LAB — CBC
HCT: 24.3 % — ABNORMAL LOW (ref 36.0–46.0)
Hemoglobin: 7.6 g/dL — ABNORMAL LOW (ref 12.0–15.0)
MCH: 23 pg — ABNORMAL LOW (ref 26.0–34.0)
MCHC: 31.3 g/dL (ref 30.0–36.0)
MCV: 73.6 fL — ABNORMAL LOW (ref 80.0–100.0)
Platelets: 326 K/uL (ref 150–400)
RBC: 3.3 MIL/uL — ABNORMAL LOW (ref 3.87–5.11)
RDW: 15.9 % — ABNORMAL HIGH (ref 11.5–15.5)
WBC: 11.9 K/uL — ABNORMAL HIGH (ref 4.0–10.5)
nRBC: 0 % (ref 0.0–0.2)

## 2024-02-16 LAB — RPR: RPR Ser Ql: NONREACTIVE

## 2024-02-16 MED ORDER — IBUPROFEN 600 MG PO TABS: 600.0000 mg | ORAL_TABLET | Freq: Four times a day (QID) | ORAL | Status: AC | PRN

## 2024-02-16 MED ORDER — FERROUS SULFATE 325 (65 FE) MG PO TABS: 325.0000 mg | ORAL_TABLET | Freq: Two times a day (BID) | ORAL | Status: AC

## 2024-02-16 MED ORDER — ACETAMINOPHEN 500 MG PO TABS: 500.0000 mg | ORAL_TABLET | Freq: Four times a day (QID) | ORAL | Status: AC | PRN

## 2024-02-16 NOTE — Plan of Care (Signed)

## 2024-02-16 NOTE — Progress Notes (Signed)
 Patient discharged. Discharge instructions given. Patient verbalizes understanding. Transported by axillary.

## 2024-02-16 NOTE — Lactation Note (Signed)
 This note was copied from a baby's chart. Lactation Consultation Note  Patient Name: Kara Nolan Unijb'd Date: 02/16/2024 Age:22 hours Reason for consult: Follow-up assessment;Early term 37-38.6wks;Breastfeeding assistance;Other (Comment) (Discharge Education)   Maternal Data Follow up assessment and discharge education w/ a 24hr old baby Kara and parents.  Patient stated that her nipples were sore and she also expressed that she hasn't been able to wake infant up for a feeding.  Patient stated the only support they had was her/her husband.    Feeding Mother's Current Feeding Choice: Breast Milk  An attempt to feed at the breast was tried.  Infant is opening mouth wide but transitions to a shallow latch.  Several attempts were made with LC providing tips and tricks on how to help infant obtain a deeper latch.  Education provided to both parents on how to assist. Infant is displaying clusterfeeding behavior.  LATCH Score Latch: Grasps breast easily, tongue down, lips flanged, rhythmical sucking.  Audible Swallowing: None  Type of Nipple: Everted at rest and after stimulation (short erect)  Comfort (Breast/Nipple): Filling, red/small blisters or bruises, mild/mod discomfort  Hold (Positioning): Assistance needed to correctly position infant at breast and maintain latch.  LATCH Score: 6   Interventions Interventions: Breast feeding basics reviewed;Assisted with latch;Support pillows;Adjust position;Position options;Breast compression;Education;CDC milk storage guidelines  LC reviewed the importance of feeding infant at least 8x w/in a 24hr period as well and rousing infant for feedings.  Examples of how to rouse infant for a feeding were discussed.  Parents verbalized understanding.  Pros and cons of pacifiers was discussed with family.  Education on pace bottle feeding was provided as well.   Encouraged parents to reach out to the outpatient lactation number for support  or to answer any questions.  Discharge Discharge Education: Engorgement and breast care;Warning signs for feeding baby;Outpatient recommendation  Education on engorgement prevention/treatment was discussed as well as breastmilk storage guidelines.  LC provided patient with a handout on breastmilk storage guidelines from [facility]. [facility] outpatient lactation services phone number written on the white board in the room.  Patient verbalized understanding.  LC also provided education from the postpartum book about warning signs to look for in a poor feeding.    Consult Status Consult Status: Complete Follow-up type: Call as needed    Nykira Reddix S Rick Warnick 02/16/2024, 10:45 AM

## 2024-03-09 ENCOUNTER — Encounter: Payer: Self-pay | Admitting: Obstetrics and Gynecology

## 2024-03-27 ENCOUNTER — Telehealth: Payer: Self-pay | Admitting: Lactation Services

## 2024-03-27 NOTE — Telephone Encounter (Signed)
 LC received a phone call from Ms. Kara Nolan to check in and see if her doctor sent an outpatient referral to see lactation.  Kara Nolan had a pending appointment for today but with no referral the appointment cannot be done.  Mom stated that she will reach out to her provider to ask them to send in the referral.   LC talked to mom about her milk supply.  Mom stated that she is only producing about 1oz.  She is not pumping consistently, and prefers using the Medela Hand Pump.  Mom expresses that she has a Motiff and a another DEBP but not sure of the brand.  Mom's cycle just returned on Sunday.   LC provided mom with a plan for the next 4 days to see if it will help increase milk supply. - Pump breast at least 8x w/in a 24hr period. - Try power pumping at least once a day. - Work on food intake and staying hydrated.  Try to make sleep a priority.  LC asked mom to call her on Thursday to give an update on the pumping and to see if she saw an increase in supply.

## 2024-07-08 ENCOUNTER — Encounter (HOSPITAL_COMMUNITY): Payer: Self-pay | Admitting: *Deleted

## 2024-07-08 ENCOUNTER — Ambulatory Visit (HOSPITAL_COMMUNITY)
Admission: EM | Admit: 2024-07-08 | Discharge: 2024-07-08 | Disposition: A | Discharge status: Home / Self Care | Attending: Emergency Medicine | Admitting: Emergency Medicine

## 2024-07-08 DIAGNOSIS — N760 Acute vaginitis: Secondary | ICD-10-CM | POA: Diagnosis not present

## 2024-07-08 DIAGNOSIS — Z3202 Encounter for pregnancy test, result negative: Secondary | ICD-10-CM | POA: Diagnosis not present

## 2024-07-08 LAB — POCT URINE PREGNANCY: Preg Test, Ur: NEGATIVE

## 2024-07-08 MED ORDER — FLUCONAZOLE 150 MG PO TABS: 150.0000 mg | ORAL_TABLET | Freq: Every day | ORAL | 0 refills | Status: DC

## 2024-07-08 NOTE — ED Provider Notes (Signed)
 " [facility]    CSN: [id number] Arrival date & time: 07/08/24  1904      History   Chief Complaint Chief Complaint  Patient presents with   Vaginal Itching    HPI Kara Nolan is a 24 y.o. female.   Patient presents to clinic over concern of vaginal itching that started shortly after unprotected intercourse.  She has been sexually active with this partner recently but most recently was this morning.  Has not had vaginal discharge or malodorous discharge.  Has not had abdominal pain or urinary symptoms.  Is requesting a pregnancy test.  The history is provided by the patient and medical records.  Vaginal Itching    Past Medical History:  Diagnosis Date   Anxiety    Depression     Patient Active Problem List   Diagnosis Date Noted   NSVD (normal spontaneous vaginal delivery) 02/16/2024   Normal labor 02/15/2024   Anxiety during pregnancy 02/15/2024   COVID-19 affecting pregnancy in third trimester 02/02/2024   Tachycardia 02/01/2024   Fetal tachycardia affecting management of mother 02/01/2024   Low maternal weight gain 02/01/2024   Beta thalassemia minor 02/01/2024   Anemia affecting pregnancy in third trimester 12/30/2023   Supervision of other normal pregnancy, antepartum 10/18/2023   Underweight 07/17/2022   Seasonal allergies 04/17/2014   Anxiety state 04/06/2013   Anxiety as acute reaction to exceptional stress 10/08/2010    Past Surgical History:  Procedure Laterality Date   MULTIPLE TOOTH EXTRACTIONS     WISDOM TOOTH EXTRACTION      OB History     Gravida  2   Para  2   Term  2   Preterm      AB      Living  2      SAB      IAB      Ectopic      Multiple      Live Births  2            Home Medications    Prior to Admission medications  Medication Sig Start Date End Date Taking? Authorizing Provider  fluconazole  (DIFLUCAN ) 150 MG tablet Take 1 tablet (150 mg total) by mouth daily. 07/08/24  Yes Breland Trouten,  Rynell Ciotti  N, FNP  acetaminophen  (TYLENOL ) 500 MG tablet Take 1 tablet (500 mg total) by mouth every 6 (six) hours as needed. 02/16/24   Myron Nest, CNM  ferrous sulfate  325 (65 FE) MG tablet Take 1 tablet (325 mg total) by mouth 2 (two) times daily with a meal. 02/16/24   Myron Nest, CNM  ibuprofen  (ADVIL ) 600 MG tablet Take 1 tablet (600 mg total) by mouth every 6 (six) hours as needed. 02/16/24   Myron Nest, CNM  Prenatal Vit-Fe Fumarate-FA (PRENATAL MULTIVITAMIN) TABS tablet Take 1 tablet by mouth daily at 12 noon. Patient not taking: Reported on 02/15/2024    [provider]    Family History Family History  Problem Relation Age of Onset   Hypertension Paternal Grandmother    Heart disease Paternal Grandmother        mitral valve prolapse   Cancer Neg Hx    Stroke Neg Hx    Depression Neg Hx     Social History Social History[1]   Allergies   Patient has no known allergies.   Review of Systems Review of Systems  Per HPI  Physical Exam Triage Vital Signs ED Triage Vitals  Encounter Vitals Group  BP 07/08/24 1922 116/74     Girls Systolic BP Percentile --      Girls Diastolic BP Percentile --      Boys Systolic BP Percentile --      Boys Diastolic BP Percentile --      Pulse Rate 07/08/24 1922 89     Resp 07/08/24 1922 14     Temp 07/08/24 1922 98.7 F (37.1 C)     Temp Source 07/08/24 1922 Oral     SpO2 07/08/24 1922 99 %     Weight --      Height --      Head Circumference --      Peak Flow --      Pain Score 07/08/24 1921 0     Pain Loc --      Pain Education --      Exclude from Growth Chart --    No data found.  Updated Vital Signs BP 116/74 (BP Location: Right Arm)   Pulse 89   Temp 98.7 F (37.1 C) (Oral)   Resp 14   LMP 06/21/2024 (Approximate)   SpO2 99%   Breastfeeding No   Visual Acuity Right Eye Distance:   Left Eye Distance:   Bilateral Distance:    Right Eye Near:   Left Eye Near:    Bilateral Near:      Physical Exam Vitals and nursing note reviewed.  Constitutional:      Appearance: Normal appearance.  HENT:     Head: Normocephalic and atraumatic.     Right Ear: External ear normal.     Left Ear: External ear normal.     Nose: Nose normal.     Mouth/Throat:     Mouth: Mucous membranes are moist.  Eyes:     Conjunctiva/sclera: Conjunctivae normal.  Cardiovascular:     Rate and Rhythm: Normal rate.  Pulmonary:     Effort: Pulmonary effort is normal. No respiratory distress.  Neurological:     General: No focal deficit present.     Mental Status: She is alert.  Psychiatric:        Mood and Affect: Mood normal.      UC Treatments / Results  Labs (all labs ordered are listed, but only abnormal results are displayed) Labs Reviewed  POCT URINE PREGNANCY  CERVICOVAGINAL ANCILLARY ONLY    EKG   Radiology No results found.  Procedures Procedures (including critical care time)  Medications Ordered in UC Medications - No data to display  Initial Impression / Assessment and Plan / UC Course  I have reviewed the triage vital signs and the nursing notes.  Pertinent labs & imaging results that were available during my care of the patient were reviewed by me and considered in my medical decision making (see chart for details).  Vitals in triage reviewed, patient is hemodynamically stable.  Vaginal itching without discharge.  Will send in Diflucan  for empiric treatment of yeast vaginitis.  Cytology swab obtained.  Urine pregnancy test is negative.  Encouraged repeat cytology and pregnancy test if concern is over encounter from today, as sexually-transmitted infection and pregnancy may not show from intercourse earlier today.  Symptomatic management for vaginal itching discussed.  Plan of care, follow-up care and return precautions given, no questions at this time.    Final Clinical Impressions(s) / UC Diagnoses   Final diagnoses:  Acute vaginitis  Urine pregnancy  test negative     Discharge Instructions      I have  sent in a Diflucan  to help with vaginal itching in the case of a yeast vaginitis.  You can also use over-the-counter Monistat which is an external cream to help with itching.  Cytology results will be back over the next few days and staff will contact you if we need to modify your treatment.  Abstain from intercourse until results have been received.      ED Prescriptions     Medication Sig Dispense Auth. Provider   fluconazole  (DIFLUCAN ) 150 MG tablet Take 1 tablet (150 mg total) by mouth daily. 1 tablet Dreama, Crescent Gotham  N, FNP      PDMP not reviewed this encounter.     [1]  Social History Tobacco Use   Smoking status: Never  Vaping Use   Vaping status: Never Used  Substance Use Topics   Alcohol use: No    Comment: Pt denied   Drug use: No    Comment: Pt denied     Dreama Marton SAILOR, FNP 07/08/24 2033  "

## 2024-07-08 NOTE — ED Triage Notes (Signed)
 Pt states she has vaginal itching since this morning. She would also like a pregnancy test

## 2024-07-08 NOTE — Discharge Instructions (Signed)
 I have sent in a Diflucan  to help with vaginal itching in the case of a yeast vaginitis.  You can also use over-the-counter Monistat which is an external cream to help with itching.  Cytology results will be back over the next few days and staff will contact you if we need to modify your treatment.  Abstain from intercourse until results have been received.

## 2024-07-10 ENCOUNTER — Ambulatory Visit (HOSPITAL_COMMUNITY): Payer: Self-pay

## 2024-07-10 ENCOUNTER — Telehealth (HOSPITAL_COMMUNITY): Payer: Self-pay

## 2024-07-10 LAB — CERVICOVAGINAL ANCILLARY ONLY
Bacterial Vaginitis (gardnerella): NEGATIVE
Candida Glabrata: NEGATIVE
Candida Vaginitis: POSITIVE — AB
Chlamydia: NEGATIVE
Comment: NEGATIVE
Comment: NEGATIVE
Comment: NEGATIVE
Comment: NEGATIVE
Comment: NEGATIVE
Comment: NORMAL
Neisseria Gonorrhea: NEGATIVE
Trichomonas: NEGATIVE

## 2024-07-10 MED ORDER — FLUCONAZOLE 150 MG PO TABS: 150.0000 mg | ORAL_TABLET | Freq: Every day | ORAL | 0 refills | Status: AC

## 2024-07-10 NOTE — Telephone Encounter (Signed)
 Pt called with concerns of vaginal itching and only had one dose of diflucan . Pt positive for yeast infection. Per protocol, diflucan  x1 dose which will equal 2 doses sent to pharmacy of pts requested. All questions answered.
